# Patient Record
Sex: Female | Born: 1945
Health system: Southern US, Community
[De-identification: ages and names within clinical notes are randomized; demographics above are authoritative.]

## PROBLEM LIST (undated history)

## (undated) ENCOUNTER — Emergency Department (HOSPITAL_COMMUNITY): Payer: Self-pay | Source: Home / Self Care

## (undated) DIAGNOSIS — E079 Disorder of thyroid, unspecified: Secondary | ICD-10-CM

## (undated) DIAGNOSIS — R0989 Other specified symptoms and signs involving the circulatory and respiratory systems: Secondary | ICD-10-CM

## (undated) DIAGNOSIS — I10 Essential (primary) hypertension: Secondary | ICD-10-CM

## (undated) DIAGNOSIS — E785 Hyperlipidemia, unspecified: Secondary | ICD-10-CM

## (undated) HISTORY — PX: ABDOMINAL HYSTERECTOMY: SHX81

## (undated) HISTORY — PX: ROOT CANAL: SHX2363

## (undated) HISTORY — DX: Hyperlipidemia, unspecified: E78.5

## (undated) HISTORY — DX: Disorder of thyroid, unspecified: E07.9

## (undated) HISTORY — PX: NASAL SEPTUM SURGERY: SHX37

## (undated) HISTORY — PX: BREAST SURGERY: SHX581

## (undated) HISTORY — DX: Other specified symptoms and signs involving the circulatory and respiratory systems: R09.89

## (undated) HISTORY — DX: Essential (primary) hypertension: I10

## (undated) HISTORY — PX: BLADDER SURGERY: SHX569

## (undated) HISTORY — PX: APPENDECTOMY: SHX54

---

## 2014-09-15 ENCOUNTER — Encounter: Payer: Self-pay | Admitting: Podiatry

## 2014-09-15 ENCOUNTER — Ambulatory Visit (INDEPENDENT_AMBULATORY_CARE_PROVIDER_SITE_OTHER): Payer: Medicare Other | Admitting: Podiatry

## 2014-09-15 VITALS — BP 148/79 | HR 79 | Resp 18

## 2014-09-15 DIAGNOSIS — S91209A Unspecified open wound of unspecified toe(s) with damage to nail, initial encounter: Secondary | ICD-10-CM

## 2014-09-15 HISTORY — DX: Unspecified open wound of unspecified toe(s) with damage to nail, initial encounter: S91.209A

## 2014-09-15 NOTE — Progress Notes (Signed)
   Subjective:    Patient ID: Jaime Sutton, female    DOB: Mar 02, 1946, 69 y.o.   MRN: [id number]  HPI I HAVE SOME FUNGUS ON BOTH OF MY BIG TOES AND I WORE A PAIR OF BOOTS LAST FALL AND 2 DAYS LATER I NOTICED MY TOENAILS WERE PURPLE AND BLUE AND MY NAILS DID FALL OFF AND THEY GREW BACK BUT THE SKIN HAS GROWN INTO MY TOENAILS This patient presents to discuss her big toenails both feet.  She says she has no pain but her nails stopped growing on her bog toes both feet.  She says there nails fell off but have grown back and stopped growing after they reached the end.  No redness or swelling or pain.  Review of Systems  All other systems reviewed and are negative.      Objective:   Physical Exam Objective: Review of past medical history, medications, social history and allergies were performed.  Vascular: Dorsalis pedis and posterior tibial pulses were palpable B/L, capillary refill was  WNL B/L, temperature gradient was WNL B/L   Skin:  No signs of symptoms of infection or ulcers on both feet  Nails: appear healthy with no signs of mycosis or infections.  The distal aspect of hallux toenails are discolored and growing into the skin at end of nail bed.  Sensory: Phoebe PerchSemmes Weinstein monifilament WNL   Orthopedic: Orthopedic evaluation demonstrates all joints distal t ankle have full ROM without crepitus, muscle power WNL B/L        Assessment & Plan:  Nail trauma Hallux nail B/L  IE.  Discussed nail injury with patient.

## 2015-10-06 HISTORY — PX: CORONARY ANGIOPLASTY WITH STENT PLACEMENT: SHX49

## 2015-11-10 DIAGNOSIS — R0989 Other specified symptoms and signs involving the circulatory and respiratory systems: Secondary | ICD-10-CM

## 2015-11-10 DIAGNOSIS — E785 Hyperlipidemia, unspecified: Secondary | ICD-10-CM | POA: Insufficient documentation

## 2015-11-10 HISTORY — DX: Other specified symptoms and signs involving the circulatory and respiratory systems: R09.89

## 2015-11-10 HISTORY — DX: Hyperlipidemia, unspecified: E78.5

## 2016-04-12 DIAGNOSIS — E039 Hypothyroidism, unspecified: Secondary | ICD-10-CM | POA: Diagnosis not present

## 2016-04-12 DIAGNOSIS — E785 Hyperlipidemia, unspecified: Secondary | ICD-10-CM | POA: Diagnosis not present

## 2016-04-12 DIAGNOSIS — R030 Elevated blood-pressure reading, without diagnosis of hypertension: Secondary | ICD-10-CM | POA: Diagnosis not present

## 2016-04-12 DIAGNOSIS — Z79899 Other long term (current) drug therapy: Secondary | ICD-10-CM | POA: Diagnosis not present

## 2016-04-12 DIAGNOSIS — M5136 Other intervertebral disc degeneration, lumbar region: Secondary | ICD-10-CM | POA: Diagnosis not present

## 2016-04-12 DIAGNOSIS — J309 Allergic rhinitis, unspecified: Secondary | ICD-10-CM | POA: Diagnosis not present

## 2016-05-09 DIAGNOSIS — E785 Hyperlipidemia, unspecified: Secondary | ICD-10-CM | POA: Diagnosis not present

## 2016-05-09 DIAGNOSIS — I10 Essential (primary) hypertension: Secondary | ICD-10-CM

## 2016-05-09 DIAGNOSIS — R0989 Other specified symptoms and signs involving the circulatory and respiratory systems: Secondary | ICD-10-CM | POA: Diagnosis not present

## 2016-05-09 HISTORY — DX: Essential (primary) hypertension: I10

## 2016-05-31 DIAGNOSIS — M5136 Other intervertebral disc degeneration, lumbar region: Secondary | ICD-10-CM | POA: Diagnosis not present

## 2016-05-31 DIAGNOSIS — M79602 Pain in left arm: Secondary | ICD-10-CM | POA: Diagnosis not present

## 2016-05-31 DIAGNOSIS — I10 Essential (primary) hypertension: Secondary | ICD-10-CM | POA: Diagnosis not present

## 2016-05-31 DIAGNOSIS — E785 Hyperlipidemia, unspecified: Secondary | ICD-10-CM | POA: Diagnosis not present

## 2016-05-31 DIAGNOSIS — Z79899 Other long term (current) drug therapy: Secondary | ICD-10-CM | POA: Diagnosis not present

## 2016-05-31 DIAGNOSIS — E039 Hypothyroidism, unspecified: Secondary | ICD-10-CM | POA: Diagnosis not present

## 2016-09-27 DIAGNOSIS — M5136 Other intervertebral disc degeneration, lumbar region: Secondary | ICD-10-CM | POA: Diagnosis not present

## 2016-09-27 DIAGNOSIS — I10 Essential (primary) hypertension: Secondary | ICD-10-CM | POA: Diagnosis not present

## 2016-09-27 DIAGNOSIS — Z79899 Other long term (current) drug therapy: Secondary | ICD-10-CM | POA: Diagnosis not present

## 2016-09-27 DIAGNOSIS — E785 Hyperlipidemia, unspecified: Secondary | ICD-10-CM | POA: Diagnosis not present

## 2016-09-27 DIAGNOSIS — E039 Hypothyroidism, unspecified: Secondary | ICD-10-CM | POA: Diagnosis not present

## 2016-10-18 DIAGNOSIS — Z1231 Encounter for screening mammogram for malignant neoplasm of breast: Secondary | ICD-10-CM | POA: Diagnosis not present

## 2016-10-18 DIAGNOSIS — I10 Essential (primary) hypertension: Secondary | ICD-10-CM | POA: Diagnosis not present

## 2016-11-08 DIAGNOSIS — I10 Essential (primary) hypertension: Secondary | ICD-10-CM | POA: Diagnosis not present

## 2016-12-02 DIAGNOSIS — H2511 Age-related nuclear cataract, right eye: Secondary | ICD-10-CM | POA: Diagnosis not present

## 2016-12-02 DIAGNOSIS — Z1231 Encounter for screening mammogram for malignant neoplasm of breast: Secondary | ICD-10-CM | POA: Diagnosis not present

## 2016-12-02 DIAGNOSIS — H26492 Other secondary cataract, left eye: Secondary | ICD-10-CM | POA: Diagnosis not present

## 2016-12-15 DIAGNOSIS — R928 Other abnormal and inconclusive findings on diagnostic imaging of breast: Secondary | ICD-10-CM | POA: Diagnosis not present

## 2016-12-15 DIAGNOSIS — R922 Inconclusive mammogram: Secondary | ICD-10-CM | POA: Diagnosis not present

## 2016-12-15 DIAGNOSIS — N6489 Other specified disorders of breast: Secondary | ICD-10-CM | POA: Diagnosis not present

## 2016-12-21 DIAGNOSIS — I10 Essential (primary) hypertension: Secondary | ICD-10-CM | POA: Diagnosis not present

## 2016-12-21 DIAGNOSIS — Z6829 Body mass index (BMI) 29.0-29.9, adult: Secondary | ICD-10-CM | POA: Diagnosis not present

## 2017-01-02 DIAGNOSIS — M533 Sacrococcygeal disorders, not elsewhere classified: Secondary | ICD-10-CM | POA: Diagnosis not present

## 2017-01-02 DIAGNOSIS — M4726 Other spondylosis with radiculopathy, lumbar region: Secondary | ICD-10-CM | POA: Diagnosis not present

## 2017-02-01 DIAGNOSIS — E785 Hyperlipidemia, unspecified: Secondary | ICD-10-CM | POA: Diagnosis not present

## 2017-02-01 DIAGNOSIS — R0602 Shortness of breath: Secondary | ICD-10-CM | POA: Diagnosis not present

## 2017-02-01 DIAGNOSIS — J309 Allergic rhinitis, unspecified: Secondary | ICD-10-CM | POA: Diagnosis not present

## 2017-02-01 DIAGNOSIS — R079 Chest pain, unspecified: Secondary | ICD-10-CM | POA: Diagnosis not present

## 2017-02-01 DIAGNOSIS — Z79899 Other long term (current) drug therapy: Secondary | ICD-10-CM | POA: Diagnosis not present

## 2017-02-01 DIAGNOSIS — I10 Essential (primary) hypertension: Secondary | ICD-10-CM | POA: Diagnosis not present

## 2017-02-01 DIAGNOSIS — E039 Hypothyroidism, unspecified: Secondary | ICD-10-CM | POA: Diagnosis not present

## 2017-03-09 DIAGNOSIS — M25521 Pain in right elbow: Secondary | ICD-10-CM | POA: Diagnosis not present

## 2017-03-09 DIAGNOSIS — K219 Gastro-esophageal reflux disease without esophagitis: Secondary | ICD-10-CM | POA: Diagnosis not present

## 2017-04-11 DIAGNOSIS — E039 Hypothyroidism, unspecified: Secondary | ICD-10-CM | POA: Diagnosis not present

## 2017-06-01 DIAGNOSIS — Z79899 Other long term (current) drug therapy: Secondary | ICD-10-CM | POA: Diagnosis not present

## 2017-06-01 DIAGNOSIS — I10 Essential (primary) hypertension: Secondary | ICD-10-CM | POA: Diagnosis not present

## 2017-06-01 DIAGNOSIS — E039 Hypothyroidism, unspecified: Secondary | ICD-10-CM | POA: Diagnosis not present

## 2017-06-01 DIAGNOSIS — K219 Gastro-esophageal reflux disease without esophagitis: Secondary | ICD-10-CM | POA: Diagnosis not present

## 2017-06-01 DIAGNOSIS — J302 Other seasonal allergic rhinitis: Secondary | ICD-10-CM | POA: Diagnosis not present

## 2017-06-01 DIAGNOSIS — E785 Hyperlipidemia, unspecified: Secondary | ICD-10-CM | POA: Diagnosis not present

## 2017-09-04 DIAGNOSIS — D689 Coagulation defect, unspecified: Secondary | ICD-10-CM | POA: Diagnosis not present

## 2017-10-12 ENCOUNTER — Other Ambulatory Visit: Payer: Self-pay

## 2017-10-18 ENCOUNTER — Encounter: Payer: Self-pay | Admitting: Cardiology

## 2017-10-31 ENCOUNTER — Encounter: Payer: Self-pay | Admitting: Cardiology

## 2017-10-31 ENCOUNTER — Ambulatory Visit (INDEPENDENT_AMBULATORY_CARE_PROVIDER_SITE_OTHER): Payer: PPO | Admitting: Cardiology

## 2017-10-31 VITALS — BP 128/72 | HR 75 | Ht 63.0 in | Wt 161.0 lb

## 2017-10-31 DIAGNOSIS — I10 Essential (primary) hypertension: Secondary | ICD-10-CM

## 2017-10-31 DIAGNOSIS — R0989 Other specified symptoms and signs involving the circulatory and respiratory systems: Secondary | ICD-10-CM | POA: Diagnosis not present

## 2017-10-31 DIAGNOSIS — E785 Hyperlipidemia, unspecified: Secondary | ICD-10-CM | POA: Diagnosis not present

## 2017-10-31 NOTE — Progress Notes (Signed)
Cardiology Office Note:    Date:  10/31/2017   ID:  Jaime Sutton, DOB [birth year], MRN [id number]  PCP:  Patient, No Pcp Per  Cardiologist:  Garwin Brothersajan R , MD   Referring MD: No ref. provider found    ASSESSMENT:    1. Bilateral carotid bruits   2. Essential hypertension   3. Dyslipidemia    PLAN:    In order of problems listed above:  1. Secondary prevention stressed with the patient.  Importance of compliance with diet and medication stressed she vocalized understanding.  Her blood pressure is stable.  Diet was discussed with dyslipidemia and lipids are followed by her primary care physician.  2. Patient has bilateral soft carotid bruits and bilateral Doppler.  Importance of losing weight was stressed.  Risks of being overweight also discussed with the patient.  Coronary angiography report done about a couple of years ago was normal.  I discussed this with her and reassured her. 3. Patient will be seen in follow-up appointment in 6 months or earlier if the patient has any concerns    Medication Adjustments/Labs and Tests Ordered: Current medicines are reviewed at length with the patient today.  Concerns regarding medicines are outlined above.  Orders Placed This Encounter  Procedures  . EKG 12-Lead   No orders of the defined types were placed in this encounter.    History of Present Illness:    Jaime Sutton is a [age] y.o. female who is being seen today for the evaluation of hypertension at the request of her primary care physician.  Patient also has dyslipidemia.  She leads a sedentary lifestyle.  She tells me that her daughter had a myocardial infarction and underwent stenting and therefore she is concerned and came here for an evaluation.  She tells me that her daughter was not in the best of health and has a significant history of smoking.  The patient does not smoke.  No chest pain orthopnea or PND.  The patient has had carotid evaluation with plaque in the past.   She requests another evaluation at this time.  Past Medical History:  Diagnosis Date  . Bilateral carotid bruits 11/10/2015  . Dyslipidemia 11/10/2015  . Essential hypertension 05/09/2016  . Thyroid disease     Past Surgical History:  Procedure Laterality Date  . ABDOMINAL HYSTERECTOMY    . APPENDECTOMY    . BLADDER SURGERY    . BREAST SURGERY     breast lift  . CORONARY ANGIOPLASTY WITH STENT PLACEMENT  10/06/2015   Left heart catheterization w/ intervention; surgeon: Merleen MillinerHerman Barrett Cheek, MD,[facility]  . NASAL SEPTUM SURGERY    . ROOT CANAL      Current Medications: Current Meds  Medication Sig  . amLODipine (NORVASC) 5 MG tablet Take 1 tablet by mouth daily.  Marland Kitchen. aspirin EC 81 MG tablet Take by mouth.  . calcium-vitamin D (OSCAL WITH D) 500-200 MG-UNIT TABS tablet Take by mouth.  . gabapentin (NEURONTIN) 300 MG capsule Take 300 mg by mouth as needed.  Marland Kitchen. levothyroxine (SYNTHROID, LEVOTHROID) 75 MCG tablet Take 75 mcg by mouth daily before breakfast.   . meloxicam (MOBIC) 15 MG tablet Take 1 tablet by mouth daily.  . simvastatin (ZOCOR) 20 MG tablet Take 20 mg by mouth daily at 6 PM.      Allergies:   Codeine   Social History   Socioeconomic History  . Marital status: Unknown    Spouse name: Not on file  . Number  of children: Not on file  . Years of education: Not on file  . Highest education level: Not on file  Occupational History  . Not on file  Social Needs  . Financial resource strain: Not on file  . Food insecurity:    Worry: Not on file    Inability: Not on file  . Transportation needs:    Medical: Not on file    Non-medical: Not on file  Tobacco Use  . Smoking status: Never Smoker  . Smokeless tobacco: Never Used  Substance and Sexual Activity  . Alcohol use: Yes    Alcohol/week: 0.0 standard drinks  . Drug use: No  . Sexual activity: Not on file  Lifestyle  . Physical activity:    Days per week: Not on file    Minutes per session: Not on file    . Stress: Not on file  Relationships  . Social connections:    Talks on phone: Not on file    Gets together: Not on file    Attends religious service: Not on file    Active member of club or organization: Not on file    Attends meetings of clubs or organizations: Not on file    Relationship status: Not on file  Other Topics Concern  . Not on file  Social History Narrative  . Not on file     Family History: The patient's family history includes Aneurysm in her sister; Cancer in her sister; Heart attack in her sister; Heart disease in her father; Heart failure in her mother and sister.  ROS:   Please see the history of present illness.    All other systems reviewed and are negative.  EKGs/Labs/Other Studies Reviewed:    The following studies were reviewed today: EKG reveals sinus rhythm and nonspecific ST-T changes.     Recent Labs: No results found for requested labs within last 8760 hours.  Recent Lipid Panel No results found for: CHOL, TRIG, HDL, CHOLHDL, VLDL, LDLCALC, LDLDIRECT  Physical Exam:    VS:  BP 128/72 (BP Location: Right Arm, Patient Position: Sitting, Cuff Size: Normal)   Pulse 75   Ht 5\' 3"  (1.6 m)   Wt 161 lb (73 kg)   SpO2 97%   BMI 28.52 kg/m     Wt Readings from Last 3 Encounters:  10/31/17 161 lb (73 kg)     GEN: Patient is in no acute distress HEENT: Normal NECK: No JVD; bilateral soft carotid bruits LYMPHATICS: No lymphadenopathy CARDIAC: S1 S2 regular, 2/6 systolic murmur at the apex. RESPIRATORY:  Clear to auscultation without rales, wheezing or rhonchi  ABDOMEN: Soft, non-tender, non-distended MUSCULOSKELETAL:  No edema; No deformity  SKIN: Warm and dry NEUROLOGIC:  Alert and oriented x 3 PSYCHIATRIC:  Normal affect    Signed, Garwin Brothers, MD  10/31/2017 9:17 AM    [facility]

## 2017-10-31 NOTE — Patient Instructions (Signed)
Medication Instructions:  Your physician recommends that you continue on your current medications as directed. Please refer to the Current Medication list given to you today.  Labwork: None  Testing/Procedures: Your physician has requested that you have a carotid duplex. This test is an ultrasound of the carotid arteries in your neck. It looks at blood flow through these arteries that supply the brain with blood. Allow one hour for this exam. There are no restrictions or special instructions.  Follow-Up: Your physician recommends that you schedule a follow-up appointment in: 9 months  Any Other Special Instructions Will Be Listed Below (If Applicable).     If you need a refill on your cardiac medications before your next appointment, please call your pharmacy.   [facility]  Garey HamAshley A, RN, BSN

## 2017-11-30 DIAGNOSIS — Z7189 Other specified counseling: Secondary | ICD-10-CM | POA: Diagnosis not present

## 2017-11-30 DIAGNOSIS — Z1339 Encounter for screening examination for other mental health and behavioral disorders: Secondary | ICD-10-CM | POA: Diagnosis not present

## 2017-11-30 DIAGNOSIS — E663 Overweight: Secondary | ICD-10-CM | POA: Diagnosis not present

## 2017-11-30 DIAGNOSIS — Z0000 Encounter for general adult medical examination without abnormal findings: Secondary | ICD-10-CM | POA: Diagnosis not present

## 2017-11-30 DIAGNOSIS — Z6828 Body mass index (BMI) 28.0-28.9, adult: Secondary | ICD-10-CM | POA: Diagnosis not present

## 2017-11-30 DIAGNOSIS — Z1331 Encounter for screening for depression: Secondary | ICD-10-CM | POA: Diagnosis not present

## 2017-11-30 DIAGNOSIS — Z1239 Encounter for other screening for malignant neoplasm of breast: Secondary | ICD-10-CM | POA: Diagnosis not present

## 2017-12-19 ENCOUNTER — Ambulatory Visit (INDEPENDENT_AMBULATORY_CARE_PROVIDER_SITE_OTHER): Payer: PPO

## 2017-12-19 DIAGNOSIS — R0989 Other specified symptoms and signs involving the circulatory and respiratory systems: Secondary | ICD-10-CM | POA: Diagnosis not present

## 2017-12-19 NOTE — Progress Notes (Signed)
Complete carotid duplex exam has been performed. Normal exam.  Jimmy  RDCS, RVT

## 2018-01-15 ENCOUNTER — Telehealth: Payer: Self-pay | Admitting: *Deleted

## 2018-01-15 NOTE — Telephone Encounter (Signed)
Patient informed of normal carotid duplex results. Patient verbalized understanding. No further questions.

## 2018-01-15 NOTE — Telephone Encounter (Signed)
I am not able to tell K myself but will you please ensure that he reads this patient's carotid. It was done in the [place] office and he was DOD, this needs to be read asap as this was almost 1 month ago. Thanks.

## 2018-01-15 NOTE — Telephone Encounter (Signed)
Pt had carotid on 10/15 and would like results. Please advise

## 2018-01-15 NOTE — Telephone Encounter (Signed)
Left message on patient's home phone per DPR for patient to return call to discuss carotid duplex results.

## 2018-01-17 DIAGNOSIS — H25811 Combined forms of age-related cataract, right eye: Secondary | ICD-10-CM | POA: Diagnosis not present

## 2018-03-05 DIAGNOSIS — Z6829 Body mass index (BMI) 29.0-29.9, adult: Secondary | ICD-10-CM | POA: Diagnosis not present

## 2018-03-05 DIAGNOSIS — W010XXA Fall on same level from slipping, tripping and stumbling without subsequent striking against object, initial encounter: Secondary | ICD-10-CM | POA: Diagnosis not present

## 2018-03-05 DIAGNOSIS — E663 Overweight: Secondary | ICD-10-CM | POA: Diagnosis not present

## 2018-03-05 DIAGNOSIS — J40 Bronchitis, not specified as acute or chronic: Secondary | ICD-10-CM | POA: Diagnosis not present

## 2018-03-28 DIAGNOSIS — Z1231 Encounter for screening mammogram for malignant neoplasm of breast: Secondary | ICD-10-CM | POA: Diagnosis not present

## 2018-06-01 DIAGNOSIS — Z6829 Body mass index (BMI) 29.0-29.9, adult: Secondary | ICD-10-CM | POA: Diagnosis not present

## 2018-06-01 DIAGNOSIS — M5136 Other intervertebral disc degeneration, lumbar region: Secondary | ICD-10-CM | POA: Diagnosis not present

## 2018-06-01 DIAGNOSIS — F5101 Primary insomnia: Secondary | ICD-10-CM | POA: Diagnosis not present

## 2018-06-01 DIAGNOSIS — I10 Essential (primary) hypertension: Secondary | ICD-10-CM | POA: Diagnosis not present

## 2018-06-01 DIAGNOSIS — E039 Hypothyroidism, unspecified: Secondary | ICD-10-CM | POA: Diagnosis not present

## 2018-06-01 DIAGNOSIS — E785 Hyperlipidemia, unspecified: Secondary | ICD-10-CM | POA: Diagnosis not present

## 2018-06-01 DIAGNOSIS — Z79899 Other long term (current) drug therapy: Secondary | ICD-10-CM | POA: Diagnosis not present

## 2018-06-26 DIAGNOSIS — Z0000 Encounter for general adult medical examination without abnormal findings: Secondary | ICD-10-CM | POA: Diagnosis not present

## 2018-06-26 DIAGNOSIS — M7989 Other specified soft tissue disorders: Secondary | ICD-10-CM | POA: Diagnosis not present

## 2018-06-26 DIAGNOSIS — Z1331 Encounter for screening for depression: Secondary | ICD-10-CM | POA: Diagnosis not present

## 2018-06-26 DIAGNOSIS — Z7189 Other specified counseling: Secondary | ICD-10-CM | POA: Diagnosis not present

## 2018-06-26 DIAGNOSIS — Z1339 Encounter for screening examination for other mental health and behavioral disorders: Secondary | ICD-10-CM | POA: Diagnosis not present

## 2018-06-26 DIAGNOSIS — Z23 Encounter for immunization: Secondary | ICD-10-CM | POA: Diagnosis not present

## 2018-07-17 DIAGNOSIS — H26492 Other secondary cataract, left eye: Secondary | ICD-10-CM | POA: Diagnosis not present

## 2018-07-17 DIAGNOSIS — H04123 Dry eye syndrome of bilateral lacrimal glands: Secondary | ICD-10-CM | POA: Diagnosis not present

## 2018-07-17 DIAGNOSIS — H26493 Other secondary cataract, bilateral: Secondary | ICD-10-CM | POA: Diagnosis not present

## 2018-10-08 ENCOUNTER — Other Ambulatory Visit: Payer: Self-pay

## 2018-12-04 DIAGNOSIS — R51 Headache: Secondary | ICD-10-CM | POA: Diagnosis not present

## 2018-12-04 DIAGNOSIS — Z6828 Body mass index (BMI) 28.0-28.9, adult: Secondary | ICD-10-CM | POA: Diagnosis not present

## 2018-12-04 DIAGNOSIS — E785 Hyperlipidemia, unspecified: Secondary | ICD-10-CM | POA: Diagnosis not present

## 2018-12-04 DIAGNOSIS — E039 Hypothyroidism, unspecified: Secondary | ICD-10-CM | POA: Diagnosis not present

## 2018-12-04 DIAGNOSIS — I10 Essential (primary) hypertension: Secondary | ICD-10-CM | POA: Diagnosis not present

## 2018-12-04 DIAGNOSIS — M79605 Pain in left leg: Secondary | ICD-10-CM | POA: Diagnosis not present

## 2018-12-04 DIAGNOSIS — M79604 Pain in right leg: Secondary | ICD-10-CM | POA: Diagnosis not present

## 2018-12-04 DIAGNOSIS — Z79899 Other long term (current) drug therapy: Secondary | ICD-10-CM | POA: Diagnosis not present

## 2019-01-01 DIAGNOSIS — M79605 Pain in left leg: Secondary | ICD-10-CM | POA: Diagnosis not present

## 2019-01-01 DIAGNOSIS — M79604 Pain in right leg: Secondary | ICD-10-CM | POA: Diagnosis not present

## 2019-01-01 DIAGNOSIS — M79661 Pain in right lower leg: Secondary | ICD-10-CM | POA: Diagnosis not present

## 2019-01-25 DIAGNOSIS — H52202 Unspecified astigmatism, left eye: Secondary | ICD-10-CM | POA: Diagnosis not present

## 2019-02-18 ENCOUNTER — Ambulatory Visit (INDEPENDENT_AMBULATORY_CARE_PROVIDER_SITE_OTHER): Payer: PPO | Admitting: Cardiology

## 2019-02-18 ENCOUNTER — Other Ambulatory Visit: Payer: Self-pay

## 2019-02-18 ENCOUNTER — Encounter: Payer: Self-pay | Admitting: Cardiology

## 2019-02-18 VITALS — BP 150/80 | HR 74 | Ht 63.0 in | Wt 158.6 lb

## 2019-02-18 DIAGNOSIS — E785 Hyperlipidemia, unspecified: Secondary | ICD-10-CM | POA: Diagnosis not present

## 2019-02-18 DIAGNOSIS — M5136 Other intervertebral disc degeneration, lumbar region: Secondary | ICD-10-CM | POA: Diagnosis not present

## 2019-02-18 DIAGNOSIS — E039 Hypothyroidism, unspecified: Secondary | ICD-10-CM | POA: Diagnosis not present

## 2019-02-18 DIAGNOSIS — I10 Essential (primary) hypertension: Secondary | ICD-10-CM

## 2019-02-18 DIAGNOSIS — Z1211 Encounter for screening for malignant neoplasm of colon: Secondary | ICD-10-CM | POA: Diagnosis not present

## 2019-02-18 DIAGNOSIS — Z6828 Body mass index (BMI) 28.0-28.9, adult: Secondary | ICD-10-CM | POA: Diagnosis not present

## 2019-02-18 NOTE — Progress Notes (Signed)
Cardiology Office Note:    Date:  02/18/2019   ID:  [place], DOB [birth year], MRN [id number]  PCP:  Patient, No Pcp Per  Cardiologist:  Jenean Lindau, MD   Referring MD: No ref. provider found    ASSESSMENT:    1. Essential hypertension   2. Dyslipidemia    PLAN:    In order of problems listed above:  1. Essential hypertension: Blood pressure stable and diet was discussed with the patient 2. Mixed dyslipidemia: Lipids followed by primary care physician.  Diet was emphasized I told her that she needs to walk at least half an hour a day 5 days a week and she promises to do so. 3. Reports of carotid evaluation are mentioned below.  They were detailed with the patient. 4. Patient will be seen in follow-up appointment in 6 months or earlier if the patient has any concerns    Medication Adjustments/Labs and Tests Ordered: Current medicines are reviewed at length with the patient today.  Concerns regarding medicines are outlined above.  No orders of the defined types were placed in this encounter.  No orders of the defined types were placed in this encounter.    No chief complaint on file.    History of Present Illness:    Jaime Sutton is a 73 y.o. female.  Patient has past medical history of essential hypertension and dyslipidemia.  She denies any problems at this time and takes care of activities of daily living.  No chest pain orthopnea or PND.  At the time of my evaluation, the patient is alert awake oriented and in no distress.  She walks on a regular basis but only 2-3 times a week.  With this she has no symptoms.  Past Medical History:  Diagnosis Date  . Bilateral carotid bruits 11/10/2015  . Dyslipidemia 11/10/2015  . Essential hypertension 05/09/2016  . Thyroid disease     Past Surgical History:  Procedure Laterality Date  . ABDOMINAL HYSTERECTOMY    . APPENDECTOMY    . BLADDER SURGERY    . BREAST SURGERY     breast lift  . CORONARY ANGIOPLASTY  WITH STENT PLACEMENT  10/06/2015   Left heart catheterization w/ intervention; surgeon: Alfonso Patten, MD,Location: [facility]  . NASAL SEPTUM SURGERY    . ROOT CANAL      Current Medications: No outpatient medications have been marked as taking for the 02/18/19 encounter (Office Visit) with , Reita Cliche, MD.     Allergies:   Codeine   Social History   Socioeconomic History  . Marital status: Unknown    Spouse name: Not on file  . Number of children: Not on file  . Years of education: Not on file  . Highest education level: Not on file  Occupational History  . Not on file  Tobacco Use  . Smoking status: Never Smoker  . Smokeless tobacco: Never Used  Substance and Sexual Activity  . Alcohol use: Yes    Alcohol/week: 0.0 standard drinks  . Drug use: No  . Sexual activity: Not on file  Other Topics Concern  . Not on file  Social History Narrative  . Not on file   Social Determinants of Health   Financial Resource Strain:   . Difficulty of Paying Living Expenses: Not on file  Food Insecurity:   . Worried About Charity fundraiser in the Last Year: Not on file  . Ran Out of Food in the Last Year: Not  on file  Transportation Needs:   . Lack of Transportation (Medical): Not on file  . Lack of Transportation (Non-Medical): Not on file  Physical Activity:   . Days of Exercise per Week: Not on file  . Minutes of Exercise per Session: Not on file  Stress:   . Feeling of Stress : Not on file  Social Connections:   . Frequency of Communication with Friends and Family: Not on file  . Frequency of Social Gatherings with Friends and Family: Not on file  . Attends Religious Services: Not on file  . Active Member of Clubs or Organizations: Not on file  . Attends Banker Meetings: Not on file  . Marital Status: Not on file     Family History: The patient's family history includes Aneurysm in her sister; Cancer in her sister; Heart attack in her sister;  Heart disease in her father; Heart failure in her mother and sister.  ROS:   Please see the history of present illness.    All other systems reviewed and are negative.  EKGs/Labs/Other Studies Reviewed:    The following studies were reviewed today: Summary: Right Carotid: There is no evidence of stenosis in the right ICA.  Left Carotid: There is no evidence of stenosis in the left ICA.  Vertebrals: Bilateral vertebral arteries demonstrate antegrade flow.  *See table(s) above for measurements and observations.     Electronically signed by Gypsy Balsam MD on 01/15/2018 at 2:05:38 PM.      Recent Labs: No results found for requested labs within last 8760 hours.  Recent Lipid Panel No results found for: CHOL, TRIG, HDL, CHOLHDL, VLDL, LDLCALC, LDLDIRECT  Physical Exam:    VS:  BP (!) 150/80 (BP Location: Left Arm, Patient Position: Sitting, Cuff Size: Normal)   Pulse 74   Ht 5\' 3"  (1.6 m)   Wt 158 lb 9.6 oz (71.9 kg)   SpO2 90%   BMI 28.09 kg/m     Wt Readings from Last 3 Encounters:  02/18/19 158 lb 9.6 oz (71.9 kg)  10/31/17 161 lb (73 kg)     GEN: Patient is in no acute distress HEENT: Normal NECK: No JVD; No carotid bruits LYMPHATICS: No lymphadenopathy CARDIAC: Hear sounds regular, 2/6 systolic murmur at the apex. RESPIRATORY:  Clear to auscultation without rales, wheezing or rhonchi  ABDOMEN: Soft, non-tender, non-distended MUSCULOSKELETAL:  No edema; No deformity  SKIN: Warm and dry NEUROLOGIC:  Alert and oriented x 3 PSYCHIATRIC:  Normal affect   Signed, 11/02/17, MD  02/18/2019 10:18 AM    [facility]

## 2019-02-18 NOTE — Patient Instructions (Signed)
Medication I nstructions:  Your physician recommends that you continue on your current medications as directed. Please refer to the Current Medication list given to you today.  *If you need a refill on your cardiac medications before your next appointment, please call your pharmacy*  Lab Work: None Ordered  Testing/Procedures: None Ordered   Follow-Up: At [facility], you and your health needs are our priority.  As part of our continuing mission to provide you with exceptional heart care, we have created designated Provider Care Teams.  These Care Teams include your primary Cardiologist (physician) and Advanced Practice Providers (APPs -  Physician Assistants and Nurse Practitioners) who all work together to provide you with the care you need, when you need it.  Your next appointment:   6 month(s)  The format for your next appointment:   In Person  Provider:   Rajan Revankar, MD  

## 2019-02-25 DIAGNOSIS — R32 Unspecified urinary incontinence: Secondary | ICD-10-CM | POA: Diagnosis not present

## 2019-06-11 DIAGNOSIS — Z1231 Encounter for screening mammogram for malignant neoplasm of breast: Secondary | ICD-10-CM | POA: Diagnosis not present

## 2019-07-03 DIAGNOSIS — Z79899 Other long term (current) drug therapy: Secondary | ICD-10-CM | POA: Diagnosis not present

## 2019-07-03 DIAGNOSIS — E785 Hyperlipidemia, unspecified: Secondary | ICD-10-CM | POA: Diagnosis not present

## 2019-07-03 DIAGNOSIS — N3946 Mixed incontinence: Secondary | ICD-10-CM | POA: Diagnosis not present

## 2019-07-03 DIAGNOSIS — E039 Hypothyroidism, unspecified: Secondary | ICD-10-CM | POA: Diagnosis not present

## 2019-07-03 DIAGNOSIS — I10 Essential (primary) hypertension: Secondary | ICD-10-CM | POA: Diagnosis not present

## 2019-07-03 DIAGNOSIS — R0683 Snoring: Secondary | ICD-10-CM | POA: Diagnosis not present

## 2019-07-03 DIAGNOSIS — Z1331 Encounter for screening for depression: Secondary | ICD-10-CM | POA: Diagnosis not present

## 2019-07-03 DIAGNOSIS — F5101 Primary insomnia: Secondary | ICD-10-CM | POA: Diagnosis not present

## 2019-07-03 DIAGNOSIS — Z7189 Other specified counseling: Secondary | ICD-10-CM | POA: Diagnosis not present

## 2019-07-03 DIAGNOSIS — Z1339 Encounter for screening examination for other mental health and behavioral disorders: Secondary | ICD-10-CM | POA: Diagnosis not present

## 2019-07-03 DIAGNOSIS — Z1159 Encounter for screening for other viral diseases: Secondary | ICD-10-CM | POA: Diagnosis not present

## 2019-07-03 DIAGNOSIS — M79601 Pain in right arm: Secondary | ICD-10-CM | POA: Diagnosis not present

## 2019-07-03 DIAGNOSIS — Z6829 Body mass index (BMI) 29.0-29.9, adult: Secondary | ICD-10-CM | POA: Diagnosis not present

## 2019-07-03 DIAGNOSIS — Z0000 Encounter for general adult medical examination without abnormal findings: Secondary | ICD-10-CM | POA: Diagnosis not present

## 2019-07-27 DIAGNOSIS — Z20828 Contact with and (suspected) exposure to other viral communicable diseases: Secondary | ICD-10-CM | POA: Diagnosis not present

## 2019-07-27 DIAGNOSIS — J069 Acute upper respiratory infection, unspecified: Secondary | ICD-10-CM | POA: Diagnosis not present

## 2019-07-27 DIAGNOSIS — J0100 Acute maxillary sinusitis, unspecified: Secondary | ICD-10-CM | POA: Diagnosis not present

## 2019-07-27 DIAGNOSIS — R0981 Nasal congestion: Secondary | ICD-10-CM | POA: Diagnosis not present

## 2019-07-31 DIAGNOSIS — H524 Presbyopia: Secondary | ICD-10-CM | POA: Diagnosis not present

## 2019-08-08 DIAGNOSIS — Z1272 Encounter for screening for malignant neoplasm of vagina: Secondary | ICD-10-CM | POA: Diagnosis not present

## 2019-08-08 DIAGNOSIS — R0981 Nasal congestion: Secondary | ICD-10-CM | POA: Diagnosis not present

## 2019-08-08 DIAGNOSIS — D649 Anemia, unspecified: Secondary | ICD-10-CM | POA: Diagnosis not present

## 2019-08-08 DIAGNOSIS — N390 Urinary tract infection, site not specified: Secondary | ICD-10-CM | POA: Diagnosis not present

## 2019-08-08 DIAGNOSIS — Z6829 Body mass index (BMI) 29.0-29.9, adult: Secondary | ICD-10-CM | POA: Diagnosis not present

## 2019-08-27 DIAGNOSIS — Z1211 Encounter for screening for malignant neoplasm of colon: Secondary | ICD-10-CM | POA: Diagnosis not present

## 2019-10-14 DIAGNOSIS — R21 Rash and other nonspecific skin eruption: Secondary | ICD-10-CM | POA: Diagnosis not present

## 2019-10-14 DIAGNOSIS — B356 Tinea cruris: Secondary | ICD-10-CM | POA: Diagnosis not present

## 2020-01-06 DIAGNOSIS — E785 Hyperlipidemia, unspecified: Secondary | ICD-10-CM | POA: Diagnosis not present

## 2020-01-06 DIAGNOSIS — D509 Iron deficiency anemia, unspecified: Secondary | ICD-10-CM | POA: Diagnosis not present

## 2020-01-06 DIAGNOSIS — E039 Hypothyroidism, unspecified: Secondary | ICD-10-CM | POA: Diagnosis not present

## 2020-01-06 DIAGNOSIS — I10 Essential (primary) hypertension: Secondary | ICD-10-CM | POA: Diagnosis not present

## 2020-01-06 DIAGNOSIS — Z79899 Other long term (current) drug therapy: Secondary | ICD-10-CM | POA: Diagnosis not present

## 2020-01-06 DIAGNOSIS — Z6828 Body mass index (BMI) 28.0-28.9, adult: Secondary | ICD-10-CM | POA: Diagnosis not present

## 2020-01-06 DIAGNOSIS — F5101 Primary insomnia: Secondary | ICD-10-CM | POA: Diagnosis not present

## 2020-02-05 ENCOUNTER — Other Ambulatory Visit: Payer: Self-pay

## 2020-02-05 DIAGNOSIS — E079 Disorder of thyroid, unspecified: Secondary | ICD-10-CM | POA: Insufficient documentation

## 2020-02-06 ENCOUNTER — Encounter: Payer: Self-pay | Admitting: Cardiology

## 2020-02-06 ENCOUNTER — Ambulatory Visit: Payer: PPO | Admitting: Cardiology

## 2020-02-06 ENCOUNTER — Telehealth: Payer: Self-pay

## 2020-02-06 ENCOUNTER — Other Ambulatory Visit: Payer: Self-pay

## 2020-02-06 VITALS — BP 155/79 | HR 70 | Ht 63.0 in | Wt 161.6 lb

## 2020-02-06 DIAGNOSIS — I10 Essential (primary) hypertension: Secondary | ICD-10-CM | POA: Diagnosis not present

## 2020-02-06 DIAGNOSIS — E785 Hyperlipidemia, unspecified: Secondary | ICD-10-CM

## 2020-02-06 MED ORDER — AMLODIPINE BESYLATE 10 MG PO TABS: 10.0000 mg | ORAL_TABLET | Freq: Every day | ORAL | 3 refills | Status: DC

## 2020-02-06 NOTE — Patient Instructions (Signed)
Medication Instructions:  Your physician has recommended you make the following change in your medication:   Start Amlodipine 10 mg daily.  *If you need a refill on your cardiac medications before your next appointment, please call your pharmacy*   Lab Work: None ordered If you have labs (blood work) drawn today and your tests are completely normal, you will receive your results only by: Marland Kitchen MyChart Message (if you have MyChart) OR . A paper copy in the mail If you have any lab test that is abnormal or we need to change your treatment, we will call you to review the results.   Testing/Procedures: We will order CT coronary calcium score $150  Please call [phone] to schedule   [facility]  [address]  [place], [place] [address]    Follow-Up: At [facility], you and your health needs are our priority.  As part of our continuing mission to provide you with exceptional heart care, we have created designated Provider Care Teams.  These Care Teams include your primary Cardiologist (physician) and Advanced Practice Providers (APPs -  Physician Assistants and Nurse Practitioners) who all work together to provide you with the care you need, when you need it.  We recommend signing up for the patient portal called "MyChart".  Sign up information is provided on this After Visit Summary.  MyChart is used to connect with patients for Virtual Visits (Telemedicine).  Patients are able to view lab/test results, encounter notes, upcoming appointments, etc.  Non-urgent messages can be sent to your provider as well.   To learn more about what you can do with MyChart, go to [email or web address].    Your next appointment:   1 month(s)  The format for your next appointment:   In Person  Provider:   Belva Crome, MD   Other Instructions  Coronary Calcium Scan A coronary calcium scan is an imaging test used to look for deposits of plaque in the inner lining of the blood  vessels of the heart (coronary arteries). Plaque is made up of calcium, protein, and fatty substances. These deposits of plaque can partly clog and narrow the coronary arteries without producing any symptoms or warning signs. This puts a person at risk for a heart attack. This test is recommended for people who are at moderate risk for heart disease. The test can find plaque deposits before symptoms develop. Tell a health care provider about:  Any allergies you have.  All medicines you are taking, including vitamins, herbs, eye drops, creams, and over-the-counter medicines.  Any problems you or family members have had with anesthetic medicines.  Any blood disorders you have.  Any surgeries you have had.  Any medical conditions you have.  Whether you are pregnant or may be pregnant. What are the risks? Generally, this is a safe procedure. However, problems may occur, including:  Harm to a pregnant woman and her unborn baby. This test involves the use of radiation. Radiation exposure can be dangerous to a pregnant woman and her unborn baby. If you are pregnant or think you may be pregnant, you should not have this procedure done.  Slight increase in the risk of cancer. This is because of the radiation involved in the test. What happens before the procedure? Ask your health care provider for any specific instructions on how to prepare for this procedure. You may be asked to avoid products that contain caffeine, tobacco, or nicotine for 4 hours before the procedure. What happens during  the procedure?   You will undress and remove any jewelry from your neck or chest.  You will put on a hospital gown.  Sticky electrodes will be placed on your chest. The electrodes will be connected to an electrocardiogram (ECG) machine to record a tracing of the electrical activity of your heart.  You will lie down on a curved bed that is attached to the CT scanner.  You may be given medicine to slow  down your heart rate so that clear pictures can be created.  You will be moved into the CT scanner, and the CT scanner will take pictures of your heart. During this time, you will be asked to lie still and hold your breath for 2-3 seconds at a time while each picture of your heart is being taken. The procedure may vary among health care providers and hospitals. What happens after the procedure?  You can get dressed.  You can return to your normal activities.  It is up to you to get the results of your procedure. Ask your health care provider, or the department that is doing the procedure, when your results will be ready. Summary  A coronary calcium scan is an imaging test used to look for deposits of plaque in the inner lining of the blood vessels of the heart (coronary arteries). Plaque is made up of calcium, protein, and fatty substances.  Generally, this is a safe procedure. Tell your health care provider if you are pregnant or may be pregnant.  Ask your health care provider for any specific instructions on how to prepare for this procedure.  A CT scanner will take pictures of your heart.  You can return to your normal activities after the scan is done. This information is not intended to replace advice given to you by your health care provider. Make sure you discuss any questions you have with your health care provider. Document Revised: 09/11/2018 Document Reviewed: 09/11/2018 Elsevier Patient Education  2020 Elsevier Inc. Amlodipine Oral Tablets What is this medicine? AMLODIPINE (am LOE di peen) is a calcium channel blocker. It relaxes your blood vessels and decreases the amount of work the heart has to do. It treats high blood pressure and/or prevents chest pain (also called angina). This medicine may be used for other purposes; ask your health care provider or pharmacist if you have questions. COMMON BRAND NAME(S): Norvasc What should I tell my health care provider before I  take this medicine? They need to know if you have any of these conditions:  heart disease  liver disease  an unusual or allergic reaction to amlodipine, other drugs, foods, dyes, or preservatives  pregnant or trying to get pregnant  breast-feeding How should I use this medicine? Take this drug by mouth. Take it as directed on the prescription label at the same time every day. You can take it with or without food. If it upsets your stomach, take it with food. Keep taking it unless your health care provider tells you to stop. Talk to your health care provider about the use of this drug in children. While it may be prescribed for children as young as 6 for selected conditions, precautions do apply. Overdosage: If you think you have taken too much of this medicine contact a poison control center or emergency room at once. NOTE: This medicine is only for you. Do not share this medicine with others. What if I miss a dose? If you miss a dose, take it as soon as you  can. If it is almost time for your next dose, take only that dose. Do not take double or extra doses. What may interact with this medicine? This medicine may interact with the following medications:  clarithromycin  cyclosporine  diltiazem  itraconazole  simvastatin  tacrolimus This list may not describe all possible interactions. Give your health care provider a list of all the medicines, herbs, non-prescription drugs, or dietary supplements you use. Also tell them if you smoke, drink alcohol, or use illegal drugs. Some items may interact with your medicine. What should I watch for while using this medicine? Visit your health care provider for regular checks on your progress. Check your blood pressure as directed. Ask your health care provider what your blood pressure should be. Also, find out when you should contact him or her. Do not treat yourself for coughs, colds, or pain while you are using this drug without asking your  health care provider for advice. Some drugs may increase your blood pressure. You may get drowsy or dizzy. Do not drive, use machinery, or do anything that needs mental alertness until you know how this drug affects you. Do not stand up or sit up quickly, especially if you are an older patient. This reduces the risk of dizzy or fainting spells. What side effects may I notice from receiving this medicine? Side effects that you should report to your doctor or health care provider as soon as possible:  allergic reactions (skin rash, itching or hives; swelling of the face, lips, or tongue)  heart attack (trouble breathing; pain or tightness in the chest, neck, back or arms; unusually weak or tired)  low blood pressure (dizziness; feeling faint or lightheaded, falls; unusually weak or tired) Side effects that usually do not require medical attention (report these to your doctor or health care provider if they continue or are bothersome):  facial flushing  nausea  palpitations  stomach pain  sudden weight gain  swelling of the ankles, feet, hands This list may not describe all possible side effects. Call your doctor for medical advice about side effects. You may report side effects to FDA at 1-800-FDA-1088. Where should I keep my medicine? Keep out of the reach of children and pets. Store at room temperature between 59 and 86 degrees F (15 and 30 degrees C). Protect from light and moisture. Keep the container tightly closed. Throw away any unused drug after the expiration date. NOTE: This sheet is a summary. It may not cover all possible information. If you have questions about this medicine, talk to your doctor, pharmacist, or health care provider.  2020 Elsevier/Gold Standard (2018-11-27 19:39:45)

## 2020-02-06 NOTE — Progress Notes (Signed)
Cardiology Office Note:    Date:  02/06/2020   ID:  Jaime Sutton, DOB 14-Jul-1945, MRN [id number]  PCP:  Philemon Kingdom, MD  Cardiologist:  Garwin Brothers, MD   Referring MD: No ref. provider found    ASSESSMENT:    1. Essential hypertension   2. Dyslipidemia    PLAN:    In order of problems listed above:  1. Primary prevention stressed with the patient.  Importance of compliance with diet medication stressed and she vocalized understanding.  She has gained weight and is leading a sedentary lifestyle.  Risks explained.  Importance of regular exercise stressed and she promises to do better. 2. Essential hypertension: Blood pressure is elevated.  Lifestyle modification and salt intake issues were discussed.  I increase amlodipine to 10 mg daily.  She will keep a track of her blood pressures and see me in follow-up appointment in a month. 3. Mixed dyslipidemia: Diet was emphasized.  She has family history of coronary artery disease.  I discussed CT calcium scoring and she is agreeable.  Further recommendations will depend except this test. 4. And had multiple questions which were answered to her satisfaction.  Follow-up appointment in a month.   Medication Adjustments/Labs and Tests Ordered: Current medicines are reviewed at length with the patient today.  Concerns regarding medicines are outlined above.  No orders of the defined types were placed in this encounter.  No orders of the defined types were placed in this encounter.    No chief complaint on file.    History of Present Illness:    Jaime Sutton is a 74 y.o. female.  Patient has past medical history of essential hypertension and dyslipidemia.  She denies any problems at this time and takes care of activities of daily living.  No chest pain orthopnea or PND.  At the time of my evaluation, the patient is alert awake oriented and in no distress.  She mentions to me that her blood pressure is elevated even at  home.  Past Medical History:  Diagnosis Date  . Bilateral carotid bruits 11/10/2015  . Dyslipidemia 11/10/2015  . Essential hypertension 05/09/2016  . Thyroid disease   . Traumatic avulsion of nail plate of toe 7/86/7672    Past Surgical History:  Procedure Laterality Date  . ABDOMINAL HYSTERECTOMY    . APPENDECTOMY    . BLADDER SURGERY    . BREAST SURGERY     breast lift  . CORONARY ANGIOPLASTY WITH STENT PLACEMENT  10/06/2015   Left heart catheterization w/ intervention; surgeon: Merleen Milliner, MD,[facility]  . NASAL SEPTUM SURGERY    . ROOT CANAL      Current Medications: Current Meds  Medication Sig  . amLODipine (NORVASC) 5 MG tablet Take 1 tablet by mouth daily.  Marland Kitchen aspirin EC 81 MG tablet Take 81 mg by mouth daily.   . calcium-vitamin D (OSCAL WITH D) 500-200 MG-UNIT TABS tablet Take by mouth.  . gabapentin (NEURONTIN) 300 MG capsule Take 300 mg by mouth as needed.  Marland Kitchen levothyroxine (SYNTHROID) 88 MCG tablet Take 88 mcg by mouth daily.  . meloxicam (MOBIC) 7.5 MG tablet Take 7.5 mg by mouth daily.   . Misc Natural Products (GLUCOSAMINE CHOND CMP TRIPLE) TABS Take 1,500 mg by mouth daily.  . Omega-3 1000 MG CAPS Take 1,000 mg by mouth daily.  . simvastatin (ZOCOR) 20 MG tablet Take 20 mg by mouth daily at 6 PM.      Allergies:  Codeine   Social History   Socioeconomic History  . Marital status: Unknown    Spouse name: Not on file  . Number of children: Not on file  . Years of education: Not on file  . Highest education level: Not on file  Occupational History  . Not on file  Tobacco Use  . Smoking status: Never Smoker  . Smokeless tobacco: Never Used  Substance and Sexual Activity  . Alcohol use: Yes    Alcohol/week: 0.0 standard drinks  . Drug use: No  . Sexual activity: Not on file  Other Topics Concern  . Not on file  Social History Narrative  . Not on file   Social Determinants of Health   Financial Resource Strain:   . Difficulty of  Paying Living Expenses: Not on file  Food Insecurity:   . Worried About Programme researcher, broadcasting/film/video in the Last Year: Not on file  . Ran Out of Food in the Last Year: Not on file  Transportation Needs:   . Lack of Transportation (Medical): Not on file  . Lack of Transportation (Non-Medical): Not on file  Physical Activity:   . Days of Exercise per Week: Not on file  . Minutes of Exercise per Session: Not on file  Stress:   . Feeling of Stress : Not on file  Social Connections:   . Frequency of Communication with Friends and Family: Not on file  . Frequency of Social Gatherings with Friends and Family: Not on file  . Attends Religious Services: Not on file  . Active Member of Clubs or Organizations: Not on file  . Attends Banker Meetings: Not on file  . Marital Status: Not on file     Family History: The patient's family history includes Aneurysm in her sister; Cancer in her sister; Heart attack in her sister; Heart disease in her father; Heart failure in her mother and sister.  ROS:   Please see the history of present illness.    All other systems reviewed and are negative.  EKGs/Labs/Other Studies Reviewed:    The following studies were reviewed today: I discussed my findings with the patient at length.  EKG reveals sinus rhythm and nonspecific ST-T changes.   Recent Labs: No results found for requested labs within last 8760 hours.  Recent Lipid Panel No results found for: CHOL, TRIG, HDL, CHOLHDL, VLDL, LDLCALC, LDLDIRECT  Physical Exam:    VS:  BP (!) 155/79   Pulse 70   Ht 5\' 3"  (1.6 m)   Wt 161 lb 9.6 oz (73.3 kg)   SpO2 97%   BMI 28.63 kg/m     Wt Readings from Last 3 Encounters:  02/06/20 161 lb 9.6 oz (73.3 kg)  02/18/19 158 lb 9.6 oz (71.9 kg)  10/31/17 161 lb (73 kg)     GEN: Patient is in no acute distress HEENT: Normal NECK: No JVD; No carotid bruits LYMPHATICS: No lymphadenopathy CARDIAC: Hear sounds regular, 2/6 systolic murmur at the  apex. RESPIRATORY:  Clear to auscultation without rales, wheezing or rhonchi  ABDOMEN: Soft, non-tender, non-distended MUSCULOSKELETAL:  No edema; No deformity  SKIN: Warm and dry NEUROLOGIC:  Alert and oriented x 3 PSYCHIATRIC:  Normal affect   Signed, 11/02/17, MD  02/06/2020 10:35 AM    [facility]

## 2020-02-06 NOTE — Telephone Encounter (Signed)
Patient given 2 bottles of Aspirin Lot number: [id number] expiration date: 03/23. °

## 2020-02-19 ENCOUNTER — Ambulatory Visit (INDEPENDENT_AMBULATORY_CARE_PROVIDER_SITE_OTHER)
Admission: RE | Admit: 2020-02-19 | Discharge: 2020-02-19 | Disposition: A | Discharge status: Home / Self Care | Payer: Self-pay | Source: Ambulatory Visit | Attending: Cardiology | Admitting: Cardiology

## 2020-02-19 ENCOUNTER — Other Ambulatory Visit: Payer: Self-pay

## 2020-02-19 DIAGNOSIS — E785 Hyperlipidemia, unspecified: Secondary | ICD-10-CM

## 2020-02-19 DIAGNOSIS — I10 Essential (primary) hypertension: Secondary | ICD-10-CM

## 2020-02-26 NOTE — Addendum Note (Signed)
Addended by: Eleonore Chiquito on: 02/26/2020 01:38 PM   Modules accepted: Orders

## 2020-03-04 DIAGNOSIS — E785 Hyperlipidemia, unspecified: Secondary | ICD-10-CM | POA: Diagnosis not present

## 2020-03-04 DIAGNOSIS — I10 Essential (primary) hypertension: Secondary | ICD-10-CM | POA: Diagnosis not present

## 2020-03-05 LAB — HEPATIC FUNCTION PANEL
ALT: 15 IU/L (ref 0–32)
AST: 20 IU/L (ref 0–40)
Albumin: 4.3 g/dL (ref 3.7–4.7)
Alkaline Phosphatase: 92 IU/L (ref 44–121)
Bilirubin Total: 0.4 mg/dL (ref 0.0–1.2)
Bilirubin, Direct: 0.15 mg/dL (ref 0.00–0.40)
Total Protein: 7.1 g/dL (ref 6.0–8.5)

## 2020-03-05 LAB — BASIC METABOLIC PANEL
BUN/Creatinine Ratio: 21 (ref 12–28)
BUN: 19 mg/dL (ref 8–27)
CO2: 29 mmol/L (ref 20–29)
Calcium: 9.5 mg/dL (ref 8.7–10.3)
Chloride: 103 mmol/L (ref 96–106)
Creatinine, Ser: 0.89 mg/dL (ref 0.57–1.00)
GFR calc Af Amer: 74 mL/min/{1.73_m2} (ref >59)
GFR calc non Af Amer: 65 mL/min/{1.73_m2} (ref >59)
Glucose: 95 mg/dL (ref 65–99)
Potassium: 4.4 mmol/L (ref 3.5–5.2)
Sodium: 143 mmol/L (ref 134–144)

## 2020-03-05 LAB — LIPID PANEL
Chol/HDL Ratio: 3.4 ratio (ref 0.0–4.4)
Cholesterol, Total: 212 mg/dL — ABNORMAL HIGH (ref 100–199)
HDL: 63 mg/dL (ref >39)
LDL Chol Calc (NIH): 128 mg/dL — ABNORMAL HIGH (ref 0–99)
Triglycerides: 118 mg/dL (ref 0–149)
VLDL Cholesterol Cal: 21 mg/dL (ref 5–40)

## 2020-03-05 MED ORDER — SIMVASTATIN 40 MG PO TABS: 40.0000 mg | ORAL_TABLET | Freq: Every day | ORAL | 3 refills | Status: DC

## 2020-03-05 NOTE — Addendum Note (Signed)
Addended by: Eleonore Chiquito on: 03/05/2020 12:59 PM   Modules accepted: Orders

## 2020-03-12 ENCOUNTER — Other Ambulatory Visit: Payer: Self-pay

## 2020-03-12 ENCOUNTER — Encounter: Payer: Self-pay | Admitting: Cardiology

## 2020-03-12 ENCOUNTER — Ambulatory Visit: Payer: PPO | Admitting: Cardiology

## 2020-03-12 VITALS — BP 140/62 | HR 84 | Ht 63.0 in | Wt 158.4 lb

## 2020-03-12 DIAGNOSIS — I700 Atherosclerosis of aorta: Secondary | ICD-10-CM

## 2020-03-12 DIAGNOSIS — E785 Hyperlipidemia, unspecified: Secondary | ICD-10-CM | POA: Diagnosis not present

## 2020-03-12 DIAGNOSIS — I10 Essential (primary) hypertension: Secondary | ICD-10-CM

## 2020-03-12 HISTORY — DX: Atherosclerosis of aorta: I70.0

## 2020-03-12 MED ORDER — LOSARTAN POTASSIUM 100 MG PO TABS: 100.0000 mg | ORAL_TABLET | Freq: Every day | ORAL | 3 refills | Status: DC

## 2020-03-12 NOTE — Patient Instructions (Signed)
Medication Instructions:  Your physician has recommended you make the following change in your medication:  STOP: Amlodipine  START: Losartan 100 mg take one tablet by mouth daily. *If you need a refill on your cardiac medications before your next appointment, please call your pharmacy*   Lab Work: Your physician recommends that you return for lab work in: 2 weeks BMP If you have labs (blood work) drawn today and your tests are completely normal, you will receive your results only by: Marland Kitchen MyChart Message (if you have MyChart) OR . A paper copy in the mail If you have any lab test that is abnormal or we need to change your treatment, we will call you to review the results.   Testing/Procedures: None   Follow-Up: At [facility], you and your health needs are our priority.  As part of our continuing mission to provide you with exceptional heart care, we have created designated Provider Care Teams.  These Care Teams include your primary Cardiologist (physician) and Advanced Practice Providers (APPs -  Physician Assistants and Nurse Practitioners) who all work together to provide you with the care you need, when you need it.  We recommend signing up for the patient portal called "MyChart".  Sign up information is provided on this After Visit Summary.  MyChart is used to connect with patients for Virtual Visits (Telemedicine).  Patients are able to view lab/test results, encounter notes, upcoming appointments, etc.  Non-urgent messages can be sent to your provider as well.   To learn more about what you can do with MyChart, go to [email or web address].    Your next appointment:   2 month(s)  The format for your next appointment:   In Person  Provider:   Belva Crome, MD   Other Instructions

## 2020-03-12 NOTE — Progress Notes (Signed)
Cardiology Office Note:    Date:  03/12/2020   ID:  Jaime Sutton, DOB 04/14/1945, MRN [id number]  PCP:  Philemon Kingdom, MD  Cardiologist:  Garwin Brothers, MD   Referring MD: Philemon Kingdom, MD    ASSESSMENT:    1. Essential hypertension   2. Dyslipidemia   3. Aortic atherosclerosis (HCC)    PLAN:    In order of problems listed above:  1. Atherosclerosis of the aorta and elevated calcium score: Secondary prevention stressed with the patient.  Importance of compliance with diet medication stressed and she vocalized understanding.  She was told to walk at least half an hour a day on a regular basis and she promises to do so.  Weight reduction was stressed. 2. Mixed dyslipidemia: Diet was emphasized.  Lipids were reviewed from [facility] sheet and she is going to do better to lose weight and get her lipids better.  Her LDL must be less than 70. 3. Essential hypertension: She is having pedal edema from amlodipine.  I will stop amlodipine.  We will start losartan 100 mg daily she will be back in 2 weeks with a log of her blood pressures and Chem-7. 4. Patient will be seen in follow-up appointment in 2 months or earlier if the patient has any concerns    Medication Adjustments/Labs and Tests Ordered: Current medicines are reviewed at length with the patient today.  Concerns regarding medicines are outlined above.  No orders of the defined types were placed in this encounter.  No orders of the defined types were placed in this encounter.    No chief complaint on file.    History of Present Illness:    Jaime Sutton is a 75 y.o. female.  Patient has past medical history of essential hypertension and dyslipidemia.  She has atherosclerotic vascular disease diagnosed by aortic atherosclerosis and elevated calcium score.  She denies any problems at this time and takes care of activities of daily living.  Her only issue is of bilateral nonpitting pedal edema ever since amlodipine  has been initiated and increased.  At the time of my evaluation she is alert awake oriented and in no distress.  Past Medical History:  Diagnosis Date  . Bilateral carotid bruits 11/10/2015  . Dyslipidemia 11/10/2015  . Essential hypertension 05/09/2016  . Thyroid disease   . Traumatic avulsion of nail plate of toe 3/53/2992    Past Surgical History:  Procedure Laterality Date  . ABDOMINAL HYSTERECTOMY    . APPENDECTOMY    . BLADDER SURGERY    . BREAST SURGERY     breast lift  . CORONARY ANGIOPLASTY WITH STENT PLACEMENT  10/06/2015   Left heart catheterization w/ intervention; surgeon: Merleen Milliner, MD,Location: [facility]  . NASAL SEPTUM SURGERY    . ROOT CANAL      Current Medications: Current Meds  Medication Sig  . amLODipine (NORVASC) 10 MG tablet Take 1 tablet (10 mg total) by mouth daily.  Marland Kitchen aspirin EC 81 MG tablet Take 81 mg by mouth daily.   . calcium-vitamin D (OSCAL WITH D) 500-200 MG-UNIT TABS tablet Take 1 tablet by mouth daily.  Marland Kitchen gabapentin (NEURONTIN) 100 MG capsule Take 100 mg by mouth as needed.  Marland Kitchen levothyroxine (SYNTHROID) 88 MCG tablet Take 88 mcg by mouth daily.  . meloxicam (MOBIC) 7.5 MG tablet Take 7.5 mg by mouth daily.   . Misc Natural Products (GLUCOSAMINE CHOND CMP TRIPLE) TABS Take 1,500 mg by mouth daily.  . Omega-3  1000 MG CAPS Take 1,000 mg by mouth daily.  . simvastatin (ZOCOR) 40 MG tablet Take 1 tablet (40 mg total) by mouth daily at 6 PM.     Allergies:   Codeine   Social History   Socioeconomic History  . Marital status: Unknown    Spouse name: Not on file  . Number of children: Not on file  . Years of education: Not on file  . Highest education level: Not on file  Occupational History  . Not on file  Tobacco Use  . Smoking status: Never Smoker  . Smokeless tobacco: Never Used  Substance and Sexual Activity  . Alcohol use: Yes    Alcohol/week: 0.0 standard drinks  . Drug use: No  . Sexual activity: Not on file  Other Topics  Concern  . Not on file  Social History Narrative  . Not on file   Social Determinants of Health   Financial Resource Strain: Not on file  Food Insecurity: Not on file  Transportation Needs: Not on file  Physical Activity: Not on file  Stress: Not on file  Social Connections: Not on file     Family History: The patient's family history includes Aneurysm in her sister; Cancer in her sister; Heart attack in her sister; Heart disease in her father; Heart failure in her mother and sister.  ROS:   Please see the history of present illness.    All other systems reviewed and are negative.  EKGs/Labs/Other Studies Reviewed:    The following studies were reviewed today: 02/19/2020 17:11  CLINICAL DATA:  Risk stratification  EXAM: Coronary Calcium Score  TECHNIQUE: The patient was scanned on a [employer]. Axial non-contrast 3 mm slices were carried out through the heart. The data set was analyzed on a dedicated work station and scored using the Agatson method.  FINDINGS: Non-cardiac: See separate report from [facility].  Ascending Aorta: Normal.  Atherosclerosis in the ascending aorta.  Pericardium: Normal  Coronary arteries: Normal origin  IMPRESSION: Coronary calcium score of 2. This was 12 percentile for age and sex matched control.  Kardie Tobb,DO   Electronically Signed   By: Thomasene Ripple DO   On: 02/19/2020 17:11   Recent Labs: 03/04/2020: ALT 15; BUN 19; Creatinine, Ser 0.89; Potassium 4.4; Sodium 143  Recent Lipid Panel    Component Value Date/Time   CHOL 212 (H) 03/04/2020 0811   TRIG 118 03/04/2020 0811   HDL 63 03/04/2020 0811   CHOLHDL 3.4 03/04/2020 0811   LDLCALC 128 (H) 03/04/2020 0811    Physical Exam:    VS:  BP 140/62   Pulse 84   Ht 5\' 3"  (1.6 m)   Wt 158 lb 6.4 oz (71.8 kg)   SpO2 95%   BMI 28.06 kg/m     Wt Readings from Last 3 Encounters:  03/12/20 158 lb 6.4 oz (71.8 kg)  02/06/20 161 lb 9.6  oz (73.3 kg)  02/18/19 158 lb 9.6 oz (71.9 kg)     GEN: Patient is in no acute distress HEENT: Normal NECK: No JVD; No carotid bruits LYMPHATICS: No lymphadenopathy CARDIAC: Hear sounds regular, 2/6 systolic murmur at the apex. RESPIRATORY:  Clear to auscultation without rales, wheezing or rhonchi  ABDOMEN: Soft, non-tender, non-distended MUSCULOSKELETAL:  No edema; No deformity  SKIN: Warm and dry NEUROLOGIC:  Alert and oriented x 3 PSYCHIATRIC:  Normal affect   Signed, 02/20/19, MD  03/12/2020 3:28 PM    [facility]

## 2020-03-31 ENCOUNTER — Other Ambulatory Visit: Payer: Self-pay

## 2020-03-31 DIAGNOSIS — I700 Atherosclerosis of aorta: Secondary | ICD-10-CM

## 2020-03-31 DIAGNOSIS — E785 Hyperlipidemia, unspecified: Secondary | ICD-10-CM

## 2020-03-31 DIAGNOSIS — I10 Essential (primary) hypertension: Secondary | ICD-10-CM | POA: Diagnosis not present

## 2020-04-01 LAB — BASIC METABOLIC PANEL
BUN/Creatinine Ratio: 27 (ref 12–28)
BUN: 25 mg/dL (ref 8–27)
CO2: 25 mmol/L (ref 20–29)
Calcium: 9.5 mg/dL (ref 8.7–10.3)
Chloride: 100 mmol/L (ref 96–106)
Creatinine, Ser: 0.92 mg/dL (ref 0.57–1.00)
GFR calc Af Amer: 71 mL/min/{1.73_m2} (ref >59)
GFR calc non Af Amer: 62 mL/min/{1.73_m2} (ref >59)
Glucose: 95 mg/dL (ref 65–99)
Potassium: 4.5 mmol/L (ref 3.5–5.2)
Sodium: 137 mmol/L (ref 134–144)

## 2020-05-04 DIAGNOSIS — M5136 Other intervertebral disc degeneration, lumbar region: Secondary | ICD-10-CM | POA: Diagnosis not present

## 2020-05-04 DIAGNOSIS — E039 Hypothyroidism, unspecified: Secondary | ICD-10-CM | POA: Diagnosis not present

## 2020-05-04 DIAGNOSIS — I10 Essential (primary) hypertension: Secondary | ICD-10-CM | POA: Diagnosis not present

## 2020-05-04 DIAGNOSIS — E785 Hyperlipidemia, unspecified: Secondary | ICD-10-CM | POA: Diagnosis not present

## 2020-05-12 ENCOUNTER — Other Ambulatory Visit: Payer: Self-pay

## 2020-05-12 ENCOUNTER — Ambulatory Visit: Payer: PPO | Admitting: Cardiology

## 2020-05-12 ENCOUNTER — Encounter: Payer: Self-pay | Admitting: Cardiology

## 2020-05-12 VITALS — BP 118/58 | HR 74 | Ht 63.0 in | Wt 144.2 lb

## 2020-05-12 DIAGNOSIS — I10 Essential (primary) hypertension: Secondary | ICD-10-CM

## 2020-05-12 DIAGNOSIS — E785 Hyperlipidemia, unspecified: Secondary | ICD-10-CM

## 2020-05-12 DIAGNOSIS — I700 Atherosclerosis of aorta: Secondary | ICD-10-CM | POA: Diagnosis not present

## 2020-05-12 NOTE — Progress Notes (Signed)
Cardiology Office Note:    Date:  05/12/2020   ID:  Jaime Sutton, DOB 10-20-1945, MRN [id number]  PCP:  Philemon Kingdom, MD  Cardiologist:  Garwin Brothers, MD   Referring MD: Philemon Kingdom, MD    ASSESSMENT:    1. Aortic atherosclerosis (HCC)   2. Essential hypertension   3. Dyslipidemia    PLAN:    In order of problems listed above:  1. Atherosclerotic vascular disease: Secondary prevention stressed to the patient.  Importance of compliance with diet medication stressed and she vocalized understanding.  She was advised to walk at least half an hour a day on a daily basis and she promises to do so. 2. Essential hypertension: Blood pressure stable and diet was emphasized.  She is happy because she is now she is off the amlodipine and her blood pressure stabilized and her pedal edema is gone.  Lifestyle modification and diet advised. 3. Mixed dyslipidemia: Lab work from December was reviewed.  I will get records from her primary care physician.  Her LDL must be less than 70 and I will review this and advise her accordingly.  Diet was emphasized.  She promises to do better. 4. Patient will be seen in follow-up appointment in 6 months or earlier if the patient has any concerns    Medication Adjustments/Labs and Tests Ordered: Current medicines are reviewed at length with the patient today.  Concerns regarding medicines are outlined above.  No orders of the defined types were placed in this encounter.  No orders of the defined types were placed in this encounter.    No chief complaint on file.    History of Present Illness:    Jaime Sutton is a 75 y.o. female.  Patient has past medical history of atherosclerotic vascular disease documented by aortic atherosclerosis, essential hypertension and dyslipidemia.  She denies any problems at this time and takes care of activities of daily living.  No chest pain orthopnea or PND.  She exercises on a regular basis.  At the  time of my evaluation, the patient is alert awake oriented and in no distress.  Past Medical History:  Diagnosis Date  . Aortic atherosclerosis (HCC) 03/12/2020  . Bilateral carotid bruits 11/10/2015  . Dyslipidemia 11/10/2015  . Essential hypertension 05/09/2016  . Thyroid disease   . Traumatic avulsion of nail plate of toe 5/88/5027    Past Surgical History:  Procedure Laterality Date  . ABDOMINAL HYSTERECTOMY    . APPENDECTOMY    . BLADDER SURGERY    . BREAST SURGERY     breast lift  . CORONARY ANGIOPLASTY WITH STENT PLACEMENT  10/06/2015   Left heart catheterization w/ intervention; surgeon: Merleen Milliner, MD,Location: [facility]  . NASAL SEPTUM SURGERY    . ROOT CANAL      Current Medications: Current Meds  Medication Sig  . aspirin EC 81 MG tablet Take 81 mg by mouth every other day.  . calcium-vitamin D (OSCAL WITH D) 500-200 MG-UNIT TABS tablet Take 1 tablet by mouth daily.  Marland Kitchen gabapentin (NEURONTIN) 100 MG capsule Take 100 mg by mouth as needed (pain).  Marland Kitchen levothyroxine (SYNTHROID) 88 MCG tablet Take 88 mcg by mouth daily.  Marland Kitchen losartan (COZAAR) 100 MG tablet Take 1 tablet (100 mg total) by mouth daily.  . meloxicam (MOBIC) 15 MG tablet Take 15 mg by mouth daily.  . Misc Natural Products (GLUCOSAMINE CHOND CMP TRIPLE) TABS Take 1,500 mg by mouth daily.  . Omega-3 1000 MG  CAPS Take 1,000 mg by mouth daily.  . simvastatin (ZOCOR) 40 MG tablet Take 1 tablet (40 mg total) by mouth daily at 6 PM.     Allergies:   Codeine   Social History   Socioeconomic History  . Marital status: Unknown    Spouse name: Not on file  . Number of children: Not on file  . Years of education: Not on file  . Highest education level: Not on file  Occupational History  . Not on file  Tobacco Use  . Smoking status: Never Smoker  . Smokeless tobacco: Never Used  Substance and Sexual Activity  . Alcohol use: Yes    Alcohol/week: 0.0 standard drinks  . Drug use: No  . Sexual activity: Not on  file  Other Topics Concern  . Not on file  Social History Narrative  . Not on file   Social Determinants of Health   Financial Resource Strain: Not on file  Food Insecurity: Not on file  Transportation Needs: Not on file  Physical Activity: Not on file  Stress: Not on file  Social Connections: Not on file     Family History: The patient's family history includes Aneurysm in her sister; Cancer in her sister; Heart attack in her sister; Heart disease in her father; Heart failure in her mother and sister.  ROS:   Please see the history of present illness.    All other systems reviewed and are negative.  EKGs/Labs/Other Studies Reviewed:    The following studies were reviewed today: CLINICAL DATA:  Risk stratification  EXAM: Coronary Calcium Score  TECHNIQUE: The patient was scanned on a [employer] scanner. Axial non-contrast 3 mm slices were carried out through the heart. The data set was analyzed on a dedicated work station and scored using the Agatson method.  FINDINGS: Non-cardiac: See separate report from [facility].  Ascending Aorta: Normal.  Atherosclerosis in the ascending aorta.  Pericardium: Normal  Coronary arteries: Normal origin  IMPRESSION: Coronary calcium score of 2. This was 12 percentile for age and sex matched control.  Kardie Tobb,DO   Electronically Signed   By: Thomasene Ripple DO   On: 02/19/2020 17:11   Recent Labs: 03/04/2020: ALT 15 03/31/2020: BUN 25; Creatinine, Ser 0.92; Potassium 4.5; Sodium 137  Recent Lipid Panel    Component Value Date/Time   CHOL 212 (H) 03/04/2020 0811   TRIG 118 03/04/2020 0811   HDL 63 03/04/2020 0811   CHOLHDL 3.4 03/04/2020 0811   LDLCALC 128 (H) 03/04/2020 0811    Physical Exam:    VS:  BP (!) 118/58   Pulse 74   Ht 5\' 3"  (1.6 m)   Wt 144 lb 3.2 oz (65.4 kg)   SpO2 96%   BMI 25.54 kg/m     Wt Readings from Last 3 Encounters:  05/12/20 144 lb 3.2 oz (65.4 kg)   03/12/20 158 lb 6.4 oz (71.8 kg)  02/06/20 161 lb 9.6 oz (73.3 kg)     GEN: Patient is in no acute distress HEENT: Normal NECK: No JVD; No carotid bruits LYMPHATICS: No lymphadenopathy CARDIAC: Hear sounds regular, 2/6 systolic murmur at the apex. RESPIRATORY:  Clear to auscultation without rales, wheezing or rhonchi  ABDOMEN: Soft, non-tender, non-distended MUSCULOSKELETAL:  No edema; No deformity  SKIN: Warm and dry NEUROLOGIC:  Alert and oriented x 3 PSYCHIATRIC:  Normal affect   Signed, 14/02/21, MD  05/12/2020 3:11 PM    [facility]

## 2020-05-12 NOTE — Patient Instructions (Signed)

## 2020-06-26 DIAGNOSIS — Z1231 Encounter for screening mammogram for malignant neoplasm of breast: Secondary | ICD-10-CM | POA: Diagnosis not present

## 2020-07-04 DIAGNOSIS — E039 Hypothyroidism, unspecified: Secondary | ICD-10-CM | POA: Diagnosis not present

## 2020-07-04 DIAGNOSIS — E785 Hyperlipidemia, unspecified: Secondary | ICD-10-CM | POA: Diagnosis not present

## 2020-07-04 DIAGNOSIS — M5136 Other intervertebral disc degeneration, lumbar region: Secondary | ICD-10-CM | POA: Diagnosis not present

## 2020-07-04 DIAGNOSIS — I10 Essential (primary) hypertension: Secondary | ICD-10-CM | POA: Diagnosis not present

## 2020-07-07 DIAGNOSIS — Z79899 Other long term (current) drug therapy: Secondary | ICD-10-CM | POA: Diagnosis not present

## 2020-07-07 DIAGNOSIS — Z7189 Other specified counseling: Secondary | ICD-10-CM | POA: Diagnosis not present

## 2020-07-07 DIAGNOSIS — E785 Hyperlipidemia, unspecified: Secondary | ICD-10-CM | POA: Diagnosis not present

## 2020-07-07 DIAGNOSIS — M542 Cervicalgia: Secondary | ICD-10-CM | POA: Diagnosis not present

## 2020-07-07 DIAGNOSIS — Z1339 Encounter for screening examination for other mental health and behavioral disorders: Secondary | ICD-10-CM | POA: Diagnosis not present

## 2020-07-07 DIAGNOSIS — D509 Iron deficiency anemia, unspecified: Secondary | ICD-10-CM | POA: Diagnosis not present

## 2020-07-07 DIAGNOSIS — E039 Hypothyroidism, unspecified: Secondary | ICD-10-CM | POA: Diagnosis not present

## 2020-07-07 DIAGNOSIS — Z1331 Encounter for screening for depression: Secondary | ICD-10-CM | POA: Diagnosis not present

## 2020-07-07 DIAGNOSIS — M5136 Other intervertebral disc degeneration, lumbar region: Secondary | ICD-10-CM | POA: Diagnosis not present

## 2020-07-07 DIAGNOSIS — Z6825 Body mass index (BMI) 25.0-25.9, adult: Secondary | ICD-10-CM | POA: Diagnosis not present

## 2020-07-07 DIAGNOSIS — Z0000 Encounter for general adult medical examination without abnormal findings: Secondary | ICD-10-CM | POA: Diagnosis not present

## 2020-07-07 DIAGNOSIS — I10 Essential (primary) hypertension: Secondary | ICD-10-CM | POA: Diagnosis not present

## 2020-07-07 DIAGNOSIS — F5101 Primary insomnia: Secondary | ICD-10-CM | POA: Diagnosis not present

## 2020-07-22 DIAGNOSIS — M4722 Other spondylosis with radiculopathy, cervical region: Secondary | ICD-10-CM | POA: Diagnosis not present

## 2020-07-22 DIAGNOSIS — M4312 Spondylolisthesis, cervical region: Secondary | ICD-10-CM | POA: Diagnosis not present

## 2020-07-24 DIAGNOSIS — M542 Cervicalgia: Secondary | ICD-10-CM | POA: Diagnosis not present

## 2020-07-24 DIAGNOSIS — M4312 Spondylolisthesis, cervical region: Secondary | ICD-10-CM | POA: Diagnosis not present

## 2020-08-07 DIAGNOSIS — M4722 Other spondylosis with radiculopathy, cervical region: Secondary | ICD-10-CM | POA: Diagnosis not present

## 2020-08-07 DIAGNOSIS — M4312 Spondylolisthesis, cervical region: Secondary | ICD-10-CM | POA: Diagnosis not present

## 2020-09-03 DIAGNOSIS — M5136 Other intervertebral disc degeneration, lumbar region: Secondary | ICD-10-CM | POA: Diagnosis not present

## 2020-09-03 DIAGNOSIS — I10 Essential (primary) hypertension: Secondary | ICD-10-CM | POA: Diagnosis not present

## 2020-09-03 DIAGNOSIS — E785 Hyperlipidemia, unspecified: Secondary | ICD-10-CM | POA: Diagnosis not present

## 2020-09-03 DIAGNOSIS — E039 Hypothyroidism, unspecified: Secondary | ICD-10-CM | POA: Diagnosis not present

## 2020-10-20 DIAGNOSIS — M4312 Spondylolisthesis, cervical region: Secondary | ICD-10-CM | POA: Diagnosis not present

## 2020-10-20 DIAGNOSIS — M5116 Intervertebral disc disorders with radiculopathy, lumbar region: Secondary | ICD-10-CM | POA: Diagnosis not present

## 2020-10-20 DIAGNOSIS — M4726 Other spondylosis with radiculopathy, lumbar region: Secondary | ICD-10-CM | POA: Diagnosis not present

## 2020-11-02 DIAGNOSIS — M5116 Intervertebral disc disorders with radiculopathy, lumbar region: Secondary | ICD-10-CM | POA: Diagnosis not present

## 2020-11-05 ENCOUNTER — Other Ambulatory Visit: Payer: Self-pay

## 2020-11-11 ENCOUNTER — Ambulatory Visit: Payer: PPO | Admitting: Cardiology

## 2020-11-11 ENCOUNTER — Other Ambulatory Visit: Payer: Self-pay

## 2020-11-11 ENCOUNTER — Encounter: Payer: Self-pay | Admitting: Cardiology

## 2020-11-11 VITALS — BP 118/62 | HR 80 | Ht 62.0 in | Wt 146.0 lb

## 2020-11-11 DIAGNOSIS — E785 Hyperlipidemia, unspecified: Secondary | ICD-10-CM

## 2020-11-11 DIAGNOSIS — I700 Atherosclerosis of aorta: Secondary | ICD-10-CM

## 2020-11-11 DIAGNOSIS — I10 Essential (primary) hypertension: Secondary | ICD-10-CM

## 2020-11-11 NOTE — Progress Notes (Signed)
Cardiology Office Note:    Date:  11/11/2020   ID:  Jaime Sutton, DOB 09-03-1945, MRN [id number]  PCP:  Philemon Kingdom, MD  Cardiologist:  Garwin Brothers, MD   Referring MD: Philemon Kingdom, MD    ASSESSMENT:    1. Aortic atherosclerosis (HCC)   2. Essential hypertension   3. Dyslipidemia    PLAN:    In order of problems listed above:  Primary prevention stressed with the patient.  Importance of compliance with diet medication stressed and vocalized understanding.  She was advised to walk at least half an hour a day 5 days a week and she promises to do so. Essential hypertension: Blood pressure stable and diet was emphasized. Aortic atherosclerosis: Secondary prevention stressed in terms of exercise and lipid-lowering Mixed dyslipidemia: Lipids were discussed and they are fine and diet emphasized.  She is on statin therapy. Patient will be seen in follow-up appointment in 6 months or earlier if the patient has any concerns    Medication Adjustments/Labs and Tests Ordered: Current medicines are reviewed at length with the patient today.  Concerns regarding medicines are outlined above.  No orders of the defined types were placed in this encounter.  No orders of the defined types were placed in this encounter.    No chief complaint on file.    History of Present Illness:    Jaime Sutton is a 75 y.o. female.  Patient has past medical history of essential hypertension, aortic atherosclerosis and dyslipidemia.  She denies any problems at this time and takes care of activities of daily living. Patient mentions to me that because of orthopedic issues she has issues with ambulation but she does her best to exercise on a regular basis.  At the time of my evaluation, the patient is alert awake oriented and in no distress.  Past Medical History:  Diagnosis Date   Aortic atherosclerosis (HCC) 03/12/2020   Bilateral carotid bruits 11/10/2015   Dyslipidemia 11/10/2015    Essential hypertension 05/09/2016   Thyroid disease    Traumatic avulsion of nail plate of toe 07/22/6158    Past Surgical History:  Procedure Laterality Date   ABDOMINAL HYSTERECTOMY     APPENDECTOMY     BLADDER SURGERY     BREAST SURGERY     breast lift   CORONARY ANGIOPLASTY WITH STENT PLACEMENT  10/06/2015   Left heart catheterization w/ intervention; surgeon: Merleen Milliner, MD,Location: [facility]   NASAL SEPTUM SURGERY     ROOT CANAL      Current Medications: Current Meds  Medication Sig   aspirin EC 81 MG tablet Take 81 mg by mouth every other day.   calcium-vitamin D (OSCAL WITH D) 500-200 MG-UNIT TABS tablet Take 1 tablet by mouth daily.   gabapentin (NEURONTIN) 100 MG capsule Take 100 mg by mouth as needed for pain (pain).   levothyroxine (SYNTHROID) 88 MCG tablet Take 88 mcg by mouth daily.   losartan (COZAAR) 100 MG tablet Take 100 mg by mouth daily.   meloxicam (MOBIC) 15 MG tablet Take 15 mg by mouth daily.   Misc Natural Products (GLUCOSAMINE CHOND CMP TRIPLE) TABS Take 1,500 mg by mouth daily.   Omega-3 1000 MG CAPS Take 1,000 mg by mouth daily.   simvastatin (ZOCOR) 40 MG tablet Take 1 tablet (40 mg total) by mouth daily at 6 PM.     Allergies:   Codeine   Social History   Socioeconomic History   Marital status: Unknown  Spouse name: Not on file   Number of children: Not on file   Years of education: Not on file   Highest education level: Not on file  Occupational History   Not on file  Tobacco Use   Smoking status: Never   Smokeless tobacco: Never  Substance and Sexual Activity   Alcohol use: Yes    Alcohol/week: 0.0 standard drinks   Drug use: No   Sexual activity: Not on file  Other Topics Concern   Not on file  Social History Narrative   Not on file   Social Determinants of Health   Financial Resource Strain: Not on file  Food Insecurity: Not on file  Transportation Needs: Not on file  Physical Activity: Not on file  Stress: Not on  file  Social Connections: Not on file     Family History: The patient's family history includes Aneurysm in her sister; Cancer in her sister; Heart attack in her sister; Heart disease in her father; Heart failure in her mother and sister.  ROS:   Please see the history of present illness.    All other systems reviewed and are negative.  EKGs/Labs/Other Studies Reviewed:    The following studies were reviewed today: I discussed my findings with the patient at length.   Recent Labs: 03/04/2020: ALT 15 03/31/2020: BUN 25; Creatinine, Ser 0.92; Potassium 4.5; Sodium 137  Recent Lipid Panel    Component Value Date/Time   CHOL 212 (H) 03/04/2020 0811   TRIG 118 03/04/2020 0811   HDL 63 03/04/2020 0811   CHOLHDL 3.4 03/04/2020 0811   LDLCALC 128 (H) 03/04/2020 0811    Physical Exam:    VS:  BP 118/62   Pulse 80   Ht 5\' 2"  (1.575 m)   Wt 146 lb (66.2 kg)   SpO2 97%   BMI 26.70 kg/m     Wt Readings from Last 3 Encounters:  11/11/20 146 lb (66.2 kg)  05/12/20 144 lb 3.2 oz (65.4 kg)  03/12/20 158 lb 6.4 oz (71.8 kg)     GEN: Patient is in no acute distress HEENT: Normal NECK: No JVD; No carotid bruits LYMPHATICS: No lymphadenopathy CARDIAC: Hear sounds regular, 2/6 systolic murmur at the apex. RESPIRATORY:  Clear to auscultation without rales, wheezing or rhonchi  ABDOMEN: Soft, non-tender, non-distended MUSCULOSKELETAL:  No edema; No deformity  SKIN: Warm and dry NEUROLOGIC:  Alert and oriented x 3 PSYCHIATRIC:  Normal affect   Signed, 05/10/20, MD  11/11/2020 9:37 AM    [facility]

## 2020-11-11 NOTE — Patient Instructions (Signed)

## 2020-11-30 DIAGNOSIS — M4312 Spondylolisthesis, cervical region: Secondary | ICD-10-CM | POA: Diagnosis not present

## 2020-11-30 DIAGNOSIS — M5116 Intervertebral disc disorders with radiculopathy, lumbar region: Secondary | ICD-10-CM | POA: Diagnosis not present

## 2020-12-04 DIAGNOSIS — E785 Hyperlipidemia, unspecified: Secondary | ICD-10-CM | POA: Diagnosis not present

## 2020-12-04 DIAGNOSIS — E039 Hypothyroidism, unspecified: Secondary | ICD-10-CM | POA: Diagnosis not present

## 2020-12-04 DIAGNOSIS — I10 Essential (primary) hypertension: Secondary | ICD-10-CM | POA: Diagnosis not present

## 2020-12-17 DIAGNOSIS — M545 Low back pain, unspecified: Secondary | ICD-10-CM | POA: Diagnosis not present

## 2020-12-17 DIAGNOSIS — M5116 Intervertebral disc disorders with radiculopathy, lumbar region: Secondary | ICD-10-CM | POA: Diagnosis not present

## 2020-12-29 DIAGNOSIS — M5431 Sciatica, right side: Secondary | ICD-10-CM | POA: Diagnosis not present

## 2020-12-29 DIAGNOSIS — M4726 Other spondylosis with radiculopathy, lumbar region: Secondary | ICD-10-CM | POA: Diagnosis not present

## 2020-12-29 DIAGNOSIS — M5116 Intervertebral disc disorders with radiculopathy, lumbar region: Secondary | ICD-10-CM | POA: Diagnosis not present

## 2021-01-14 DIAGNOSIS — D509 Iron deficiency anemia, unspecified: Secondary | ICD-10-CM | POA: Diagnosis not present

## 2021-01-14 DIAGNOSIS — E785 Hyperlipidemia, unspecified: Secondary | ICD-10-CM | POA: Diagnosis not present

## 2021-01-14 DIAGNOSIS — E039 Hypothyroidism, unspecified: Secondary | ICD-10-CM | POA: Diagnosis not present

## 2021-01-14 DIAGNOSIS — Z79899 Other long term (current) drug therapy: Secondary | ICD-10-CM | POA: Diagnosis not present

## 2021-01-14 DIAGNOSIS — N390 Urinary tract infection, site not specified: Secondary | ICD-10-CM | POA: Diagnosis not present

## 2021-01-14 DIAGNOSIS — Z6828 Body mass index (BMI) 28.0-28.9, adult: Secondary | ICD-10-CM | POA: Diagnosis not present

## 2021-01-14 DIAGNOSIS — I10 Essential (primary) hypertension: Secondary | ICD-10-CM | POA: Diagnosis not present

## 2021-02-02 IMAGING — CT CT CARDIAC CORONARY ARTERY CALCIUM SCORE
3 series · 14 of 20 positions shown, 15 images · non-contrast
Comparison: None.
COMPARISON: None.

Addendum:
EXAM:
OVER-READ INTERPRETATION  CT CHEST

The following report is an over-read performed by radiologist Dr.
Hamish Eric Waltho [REDACTED] on 02/19/2020. This
over-read does not include interpretation of cardiac or coronary
anatomy or pathology. The coronary calcium score interpretation by
the cardiologist is attached.
CLINICAL DATA: Risk stratification
Coronary Calcium Score
TECHNIQUE: The patient was scanned on a [employer] scanner. Axial
non-contrast 3 mm slices were carried out through the heart. The
data set was analyzed on a dedicated work station and scored using
the Agatson method.

[Series 2: casc 3.0 bv41 2 bestdiast 70 % · axial · 0.34mm/px · z∈[-239,-161]mm · 4 of 44 slices shown, 5 images]
[im 9/44  vessel]
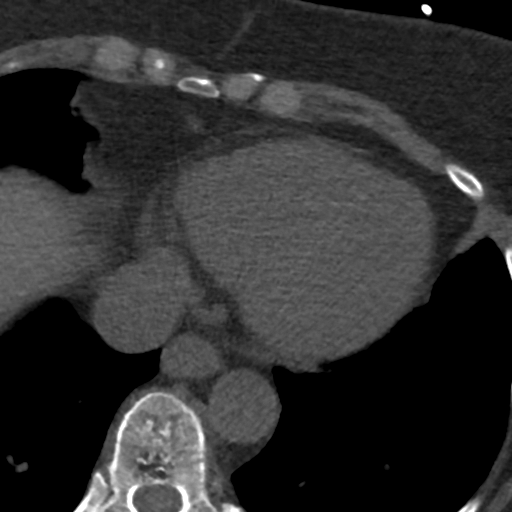
[im 9/44  lung]
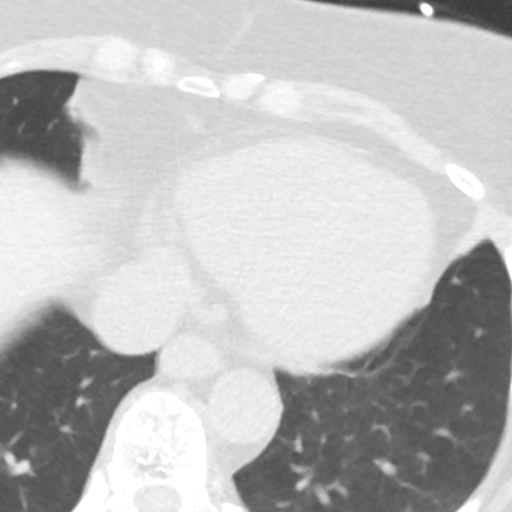
[im 18/44  vessel]
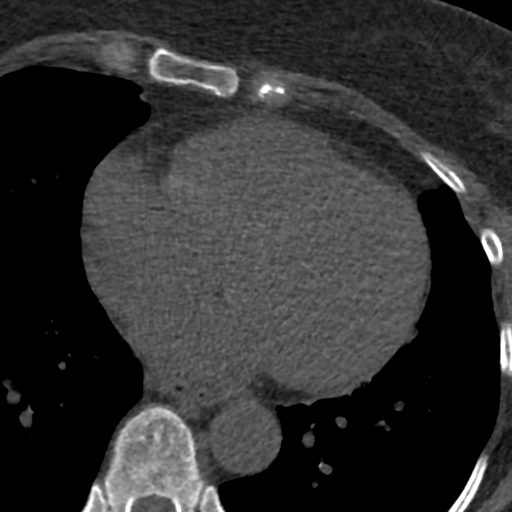
[im 26/44  vessel]
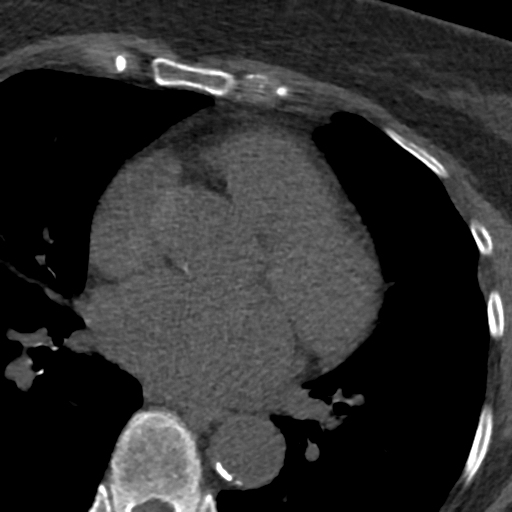
[im 35/44  vessel]
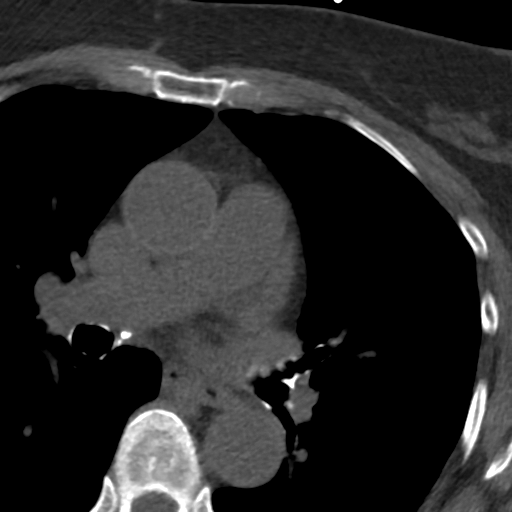

[Series 3: lung 71 % · axial · 0.64mm/px · z∈[-242,-158]mm · 5 of 44 slices shown]
[im 8/44  lung]
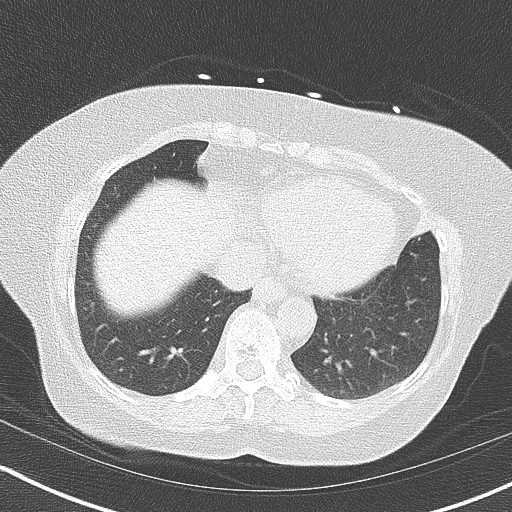
[im 15/44  lung]
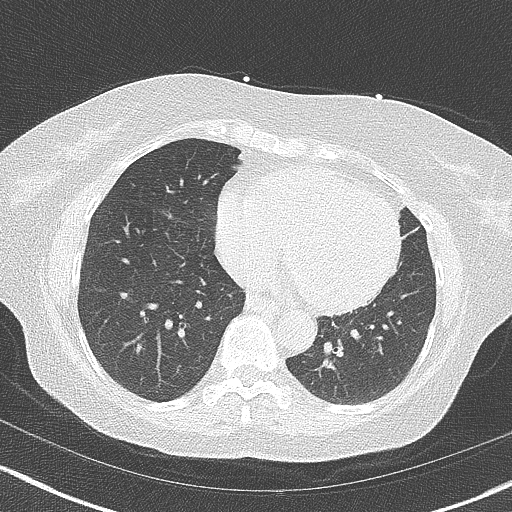
[im 22/44  lung]
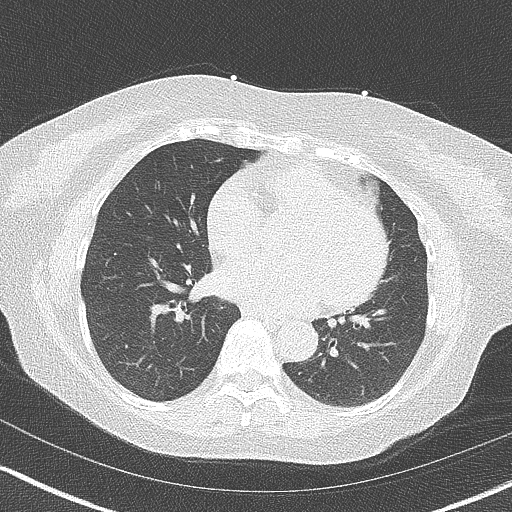
[im 29/44  lung]
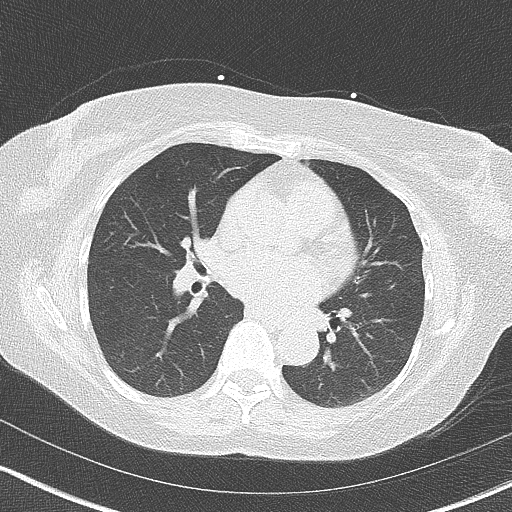
[im 36/44  lung]
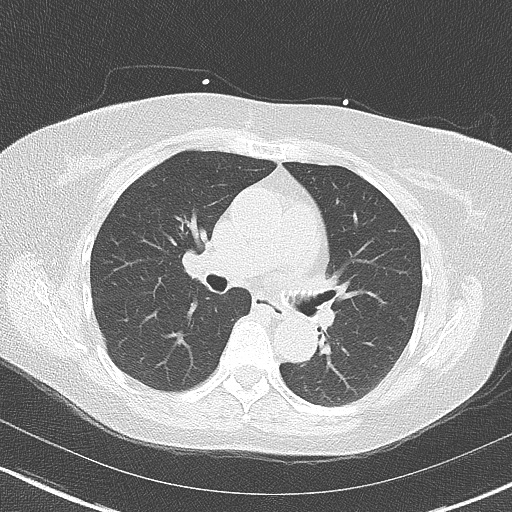

[Series 4: lung st 71 % · axial · 0.64mm/px · z∈[-242,-158]mm · 5 of 44 slices shown]
[im 8/44  lung]
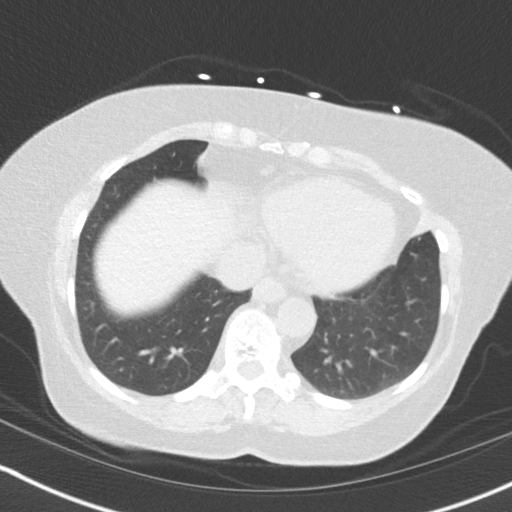
[im 15/44  lung]
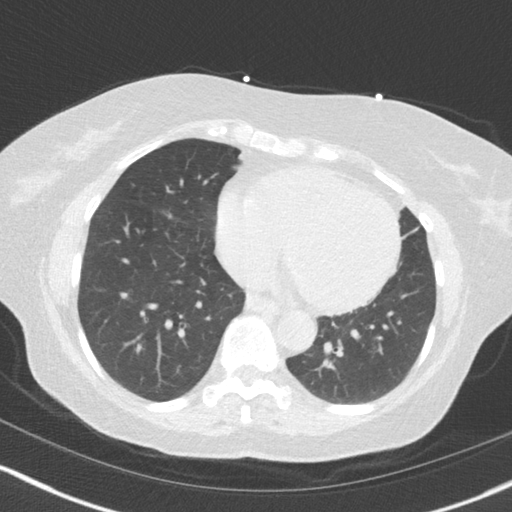
[im 22/44  lung]
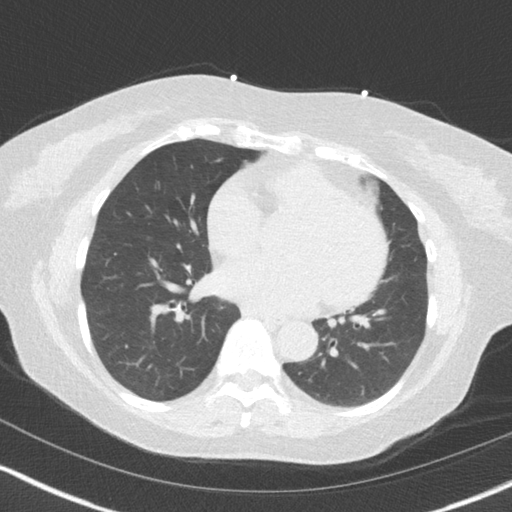
[im 29/44  lung]
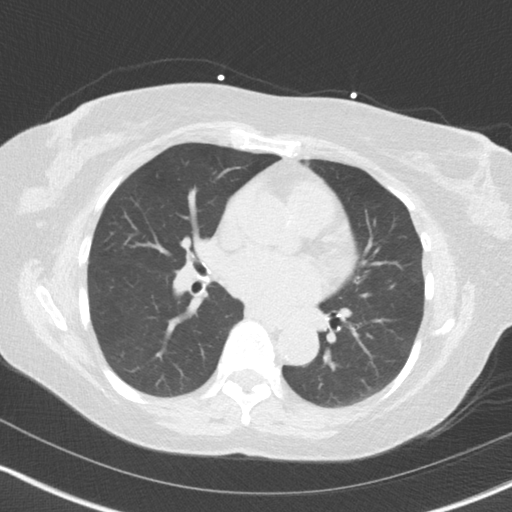
[im 36/44  lung]
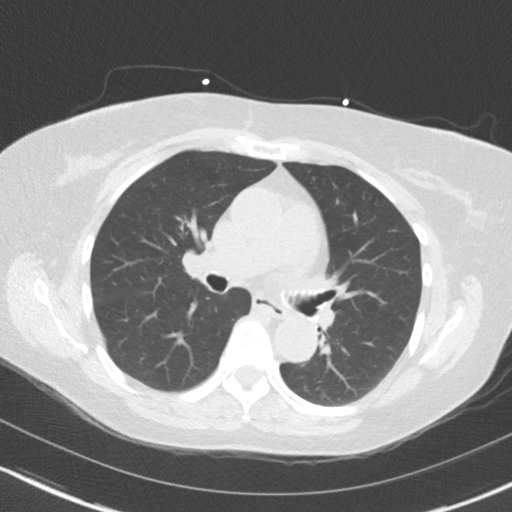

[14 of 20 positions shown; findings below may reference images not displayed]

FINDINGS: Aortic atherosclerosis. Within the visualized portions of the thorax
there are no suspicious appearing pulmonary nodules or masses, there
is no acute consolidative airspace disease, no pleural effusions, no
pneumothorax and no lymphadenopathy. Visualized portions of the
upper abdomen are unremarkable. There are no aggressive appearing
lytic or blastic lesions noted in the visualized portions of the
skeleton.
IMPRESSION: 1.  Aortic Atherosclerosis (1JTO3-7NX.X).
FINDINGS: Non-cardiac: See separate report from [REDACTED].

Ascending Aorta: Normal.  Atherosclerosis in the ascending aorta.

Pericardium: Normal

Coronary arteries: Normal origin
IMPRESSION: Coronary calcium score of 2. This was 12 percentile for age and sex
matched control.

Nylma Nismal,DO

*** End of Addendum ***
EXAM:
OVER-READ INTERPRETATION  CT CHEST

The following report is an over-read performed by radiologist Dr.
Hamish Eric Waltho [REDACTED] on 02/19/2020. This
over-read does not include interpretation of cardiac or coronary
anatomy or pathology. The coronary calcium score interpretation by
the cardiologist is attached.
FINDINGS: Aortic atherosclerosis. Within the visualized portions of the thorax
there are no suspicious appearing pulmonary nodules or masses, there
is no acute consolidative airspace disease, no pleural effusions, no
pneumothorax and no lymphadenopathy. Visualized portions of the
upper abdomen are unremarkable. There are no aggressive appearing
lytic or blastic lesions noted in the visualized portions of the
skeleton.
IMPRESSION: 1.  Aortic Atherosclerosis (1JTO3-7NX.X).

## 2021-02-05 DIAGNOSIS — L821 Other seborrheic keratosis: Secondary | ICD-10-CM | POA: Diagnosis not present

## 2021-02-05 DIAGNOSIS — L728 Other follicular cysts of the skin and subcutaneous tissue: Secondary | ICD-10-CM | POA: Diagnosis not present

## 2021-02-05 DIAGNOSIS — L700 Acne vulgaris: Secondary | ICD-10-CM | POA: Diagnosis not present

## 2021-02-05 DIAGNOSIS — L820 Inflamed seborrheic keratosis: Secondary | ICD-10-CM | POA: Diagnosis not present

## 2021-02-08 DIAGNOSIS — S60351A Superficial foreign body of right thumb, initial encounter: Secondary | ICD-10-CM | POA: Diagnosis not present

## 2021-02-18 DIAGNOSIS — Z6829 Body mass index (BMI) 29.0-29.9, adult: Secondary | ICD-10-CM | POA: Diagnosis not present

## 2021-02-18 DIAGNOSIS — E039 Hypothyroidism, unspecified: Secondary | ICD-10-CM | POA: Diagnosis not present

## 2021-02-24 DIAGNOSIS — Z8249 Family history of ischemic heart disease and other diseases of the circulatory system: Secondary | ICD-10-CM | POA: Diagnosis not present

## 2021-02-24 DIAGNOSIS — I6523 Occlusion and stenosis of bilateral carotid arteries: Secondary | ICD-10-CM | POA: Diagnosis not present

## 2021-02-24 DIAGNOSIS — I700 Atherosclerosis of aorta: Secondary | ICD-10-CM | POA: Diagnosis not present

## 2021-02-24 DIAGNOSIS — I6529 Occlusion and stenosis of unspecified carotid artery: Secondary | ICD-10-CM | POA: Diagnosis not present

## 2021-02-25 ENCOUNTER — Other Ambulatory Visit: Payer: Self-pay | Admitting: Cardiology

## 2021-03-05 DIAGNOSIS — E039 Hypothyroidism, unspecified: Secondary | ICD-10-CM | POA: Diagnosis not present

## 2021-03-05 DIAGNOSIS — I10 Essential (primary) hypertension: Secondary | ICD-10-CM | POA: Diagnosis not present

## 2021-03-05 DIAGNOSIS — E785 Hyperlipidemia, unspecified: Secondary | ICD-10-CM | POA: Diagnosis not present

## 2021-03-31 ENCOUNTER — Other Ambulatory Visit: Payer: Self-pay | Admitting: Cardiology

## 2021-04-14 DIAGNOSIS — E039 Hypothyroidism, unspecified: Secondary | ICD-10-CM | POA: Diagnosis not present

## 2021-04-29 DIAGNOSIS — Z20828 Contact with and (suspected) exposure to other viral communicable diseases: Secondary | ICD-10-CM | POA: Diagnosis not present

## 2021-04-29 DIAGNOSIS — J069 Acute upper respiratory infection, unspecified: Secondary | ICD-10-CM | POA: Diagnosis not present

## 2021-04-29 DIAGNOSIS — R051 Acute cough: Secondary | ICD-10-CM | POA: Diagnosis not present

## 2021-05-12 ENCOUNTER — Other Ambulatory Visit: Payer: Self-pay

## 2021-05-14 ENCOUNTER — Other Ambulatory Visit: Payer: Self-pay

## 2021-05-14 ENCOUNTER — Encounter: Payer: Self-pay | Admitting: Cardiology

## 2021-05-14 ENCOUNTER — Ambulatory Visit: Payer: PPO | Admitting: Cardiology

## 2021-05-14 VITALS — BP 116/60 | HR 71 | Ht 62.0 in | Wt 143.2 lb

## 2021-05-14 DIAGNOSIS — E785 Hyperlipidemia, unspecified: Secondary | ICD-10-CM | POA: Diagnosis not present

## 2021-05-14 DIAGNOSIS — I6529 Occlusion and stenosis of unspecified carotid artery: Secondary | ICD-10-CM

## 2021-05-14 DIAGNOSIS — I10 Essential (primary) hypertension: Secondary | ICD-10-CM

## 2021-05-14 DIAGNOSIS — I6523 Occlusion and stenosis of bilateral carotid arteries: Secondary | ICD-10-CM | POA: Diagnosis not present

## 2021-05-14 DIAGNOSIS — I700 Atherosclerosis of aorta: Secondary | ICD-10-CM

## 2021-05-14 HISTORY — DX: Occlusion and stenosis of unspecified carotid artery: I65.29

## 2021-05-14 NOTE — Patient Instructions (Signed)
Medication Instructions:  ?Your physician recommends that you continue on your current medications as directed. Please refer to the Current Medication list given to you today. ? ?*If you need a refill on your cardiac medications before your next appointment, please call your pharmacy* ? ? ?Lab Work: ?Your physician recommends that you return for lab work in: In next few days for fasting blood work. Lab opens at 8am, you do not need appointment ? ?If you have labs (blood work) drawn today and your tests are completely normal, you will receive your results only by: ?MyChart Message (if you have MyChart) OR ?A paper copy in the mail ?If you have any lab test that is abnormal or we need to change your treatment, we will call you to review the results. ? ? ?Testing/Procedures: ?NONE ? ? ?Follow-Up: ?At [facility], you and your health needs are our priority.  As part of our continuing mission to provide you with exceptional heart care, we have created designated Provider Care Teams.  These Care Teams include your primary Cardiologist (physician) and Advanced Practice Providers (APPs -  Physician Assistants and Nurse Practitioners) who all work together to provide you with the care you need, when you need it. ? ?We recommend signing up for the patient portal called "MyChart".  Sign up information is provided on this After Visit Summary.  MyChart is used to connect with patients for Virtual Visits (Telemedicine).  Patients are able to view lab/test results, encounter notes, upcoming appointments, etc.  Non-urgent messages can be sent to your provider as well.   ?To learn more about what you can do with MyChart, go to [email or web address].   ? ?Your next appointment:   ?9 month(s) ? ?The format for your next appointment:   ?In Person ? ?Provider:   ?Jyl Heinz, MD  ? ? ?Other Instructions ?  ?

## 2021-05-14 NOTE — Progress Notes (Signed)
?Cardiology Office Note:   ? ?Date:  05/14/2021  ? ?ID:  Jaime Sutton, DOB November 16, 1945, MRN [id number] ? ?PCP:  Philemon Kingdom, MD  ?Cardiologist:  Garwin Brothers, MD  ? ?Referring MD: Philemon Kingdom, MD  ? ? ?ASSESSMENT:   ? ?1. Aortic atherosclerosis (HCC)   ?2. Essential hypertension   ?3. Dyslipidemia   ?4. Atherosclerosis of both carotid arteries   ? ?PLAN:   ? ?In order of problems listed above: ? ?Aortic atherosclerosis: Secondary prevention stressed with the patient.  Importance of compliance with diet medication stressed and she vocalized understanding.  She was advised to walk at least half an hour a day 5 days a week and she promises to do so. ?Essential hypertension: Blood pressure stable and diet was emphasized. ?Carotid atherosclerosis: [facility] hospital report was reviewed and discussed with the patient.  She has significant nonobstructive atherosclerosis. ?Mixed dyslipidemia: Diet was emphasized.  Lifestyle modification urged.  Blood work was done in the month of November.  She is advised to come back in the next few days for fasting blood work.  Her goal LDL is less than 70 and I may have to change her to rosuvastatin to get the optimal results. ?Patient will be seen in follow-up appointment in 6 months or earlier if the patient has any concerns ? ? ? ?Medication Adjustments/Labs and Tests Ordered: ?Current medicines are reviewed at length with the patient today.  Concerns regarding medicines are outlined above.  ?Orders Placed This Encounter  ?Procedures  ? Basic metabolic panel  ? Hepatic function panel  ? Lipid panel  ? EKG 12-Lead  ? ?No orders of the defined types were placed in this encounter. ? ? ? ?No chief complaint on file. ?  ? ?History of Present Illness:   ? ?Jaime Sutton is a 76 y.o. female.  Patient has past medical history of aortic and bilateral carotid atherosclerosis.  She has history of hypertension and dyslipidemia.  She denies any problems at this time and takes  care of activities of daily living.  No chest pain orthopnea or PND.  At the time of my evaluation, the patient is alert awake oriented and in no distress. ? ?Past Medical History:  ?Diagnosis Date  ? Aortic atherosclerosis (HCC) 03/12/2020  ? Bilateral carotid bruits 11/10/2015  ? Dyslipidemia 11/10/2015  ? Essential hypertension 05/09/2016  ? Thyroid disease   ? Traumatic avulsion of nail plate of toe 8/41/6606  ? ? ?Past Surgical History:  ?Procedure Laterality Date  ? ABDOMINAL HYSTERECTOMY    ? APPENDECTOMY    ? BLADDER SURGERY    ? BREAST SURGERY    ? breast lift  ? CORONARY ANGIOPLASTY WITH STENT PLACEMENT  10/06/2015  ? Left heart catheterization w/ intervention; surgeon: Merleen Milliner, MD,Location: [facility]  ? NASAL SEPTUM SURGERY    ? ROOT CANAL    ? ? ?Current Medications: ?Current Meds  ?Medication Sig  ? acyclovir (ZOVIRAX) 400 MG tablet Take 800 mg by mouth 4 (four) times daily as needed.  ? aspirin EC 81 MG tablet Take 81 mg by mouth every other day.  ? benzonatate (TESSALON) 100 MG capsule Take 100 mg by mouth 3 (three) times daily as needed.  ? calcium-vitamin D (OSCAL WITH D) 500-200 MG-UNIT TABS tablet Take 1 tablet by mouth daily.  ? cyclobenzaprine (FLEXERIL) 5 MG tablet Take 5 mg by mouth 2 (two) times daily.  ? gabapentin (NEURONTIN) 100 MG capsule Take 100 mg by mouth as needed  for pain (pain).  ? levothyroxine (SYNTHROID) 112 MCG tablet Take 112 mcg by mouth every morning.  ? losartan (COZAAR) 100 MG tablet Take 1 tablet (100 mg total) by mouth daily.  ? meloxicam (MOBIC) 15 MG tablet Take 15 mg by mouth daily.  ? Misc Natural Products (GLUCOSAMINE CHOND CMP TRIPLE) TABS Take 1,500 mg by mouth daily.  ? nitrofurantoin, macrocrystal-monohydrate, (MACROBID) 100 MG capsule Take 100 mg by mouth every 12 (twelve) hours.  ? Omega-3 1000 MG CAPS Take 1,000 mg by mouth daily.  ? simvastatin (ZOCOR) 40 MG tablet TAKE 1 TABLET(40 MG) BY MOUTH DAILY AT 6 PM  ?  ? ?Allergies:   Codeine  ? ?Social History   ? ?Socioeconomic History  ? Marital status: Unknown  ?  Spouse name: Not on file  ? Number of children: Not on file  ? Years of education: Not on file  ? Highest education level: Not on file  ?Occupational History  ? Not on file  ?Tobacco Use  ? Smoking status: Never  ? Smokeless tobacco: Never  ?Substance and Sexual Activity  ? Alcohol use: Yes  ?  Alcohol/week: 0.0 standard drinks  ? Drug use: No  ? Sexual activity: Not on file  ?Other Topics Concern  ? Not on file  ?Social History Narrative  ? Not on file  ? ?Social Determinants of Health  ? ?Financial Resource Strain: Not on file  ?Food Insecurity: Not on file  ?Transportation Needs: Not on file  ?Physical Activity: Not on file  ?Stress: Not on file  ?Social Connections: Not on file  ?  ? ?Family History: ?The patient's family history includes Aneurysm in her sister; Cancer in her sister; Heart attack in her sister; Heart disease in her father; Heart failure in her mother and sister. ? ?ROS:   ?Please see the history of present illness.    ?All other systems reviewed and are negative. ? ?EKGs/Labs/Other Studies Reviewed:   ? ?The following studies were reviewed today: ?I discussed my findings with the patient at length. ? ? ?Recent Labs: ?No results found for requested labs within last 8760 hours.  ?Recent Lipid Panel ?   ?Component Value Date/Time  ? CHOL 212 (H) 03/04/2020 0354  ? TRIG 118 03/04/2020 0811  ? HDL 63 03/04/2020 0811  ? CHOLHDL 3.4 03/04/2020 0811  ? LDLCALC 128 (H) 03/04/2020 6568  ? ? ?Physical Exam:   ? ?VS:  BP 116/60   Pulse 71   Ht 5\' 2"  (1.575 m)   Wt 143 lb 3.2 oz (65 kg)   SpO2 97%   BMI 26.19 kg/m?    ? ?Wt Readings from Last 3 Encounters:  ?05/14/21 143 lb 3.2 oz (65 kg)  ?11/11/20 146 lb (66.2 kg)  ?05/12/20 144 lb 3.2 oz (65.4 kg)  ?  ? ?GEN: Patient is in no acute distress ?HEENT: Normal ?NECK: No JVD; No carotid bruits ?LYMPHATICS: No lymphadenopathy ?CARDIAC: Hear sounds regular, 2/6 systolic murmur at the  apex. ?RESPIRATORY:  Clear to auscultation without rales, wheezing or rhonchi  ?ABDOMEN: Soft, non-tender, non-distended ?MUSCULOSKELETAL:  No edema; No deformity  ?SKIN: Warm and dry ?NEUROLOGIC:  Alert and oriented x 3 ?PSYCHIATRIC:  Normal affect  ? ?Signed, ?07/12/20, MD  ?05/14/2021 10:55 AM    ?[facility]  ?

## 2021-06-01 DIAGNOSIS — E785 Hyperlipidemia, unspecified: Secondary | ICD-10-CM | POA: Diagnosis not present

## 2021-06-01 DIAGNOSIS — I10 Essential (primary) hypertension: Secondary | ICD-10-CM | POA: Diagnosis not present

## 2021-06-01 DIAGNOSIS — I700 Atherosclerosis of aorta: Secondary | ICD-10-CM | POA: Diagnosis not present

## 2021-06-02 LAB — BASIC METABOLIC PANEL
BUN/Creatinine Ratio: 19 (ref 12–28)
BUN: 20 mg/dL (ref 8–27)
CO2: 28 mmol/L (ref 20–29)
Calcium: 9.9 mg/dL (ref 8.7–10.3)
Chloride: 107 mmol/L — ABNORMAL HIGH (ref 96–106)
Creatinine, Ser: 1.06 mg/dL — ABNORMAL HIGH (ref 0.57–1.00)
Glucose: 91 mg/dL (ref 70–99)
Potassium: 4.8 mmol/L (ref 3.5–5.2)
Sodium: 145 mmol/L — ABNORMAL HIGH (ref 134–144)
eGFR: 55 mL/min/{1.73_m2} — ABNORMAL LOW (ref >59)

## 2021-06-02 LAB — HEPATIC FUNCTION PANEL
ALT: 13 IU/L (ref 0–32)
AST: 21 IU/L (ref 0–40)
Albumin: 4.4 g/dL (ref 3.7–4.7)
Alkaline Phosphatase: 64 IU/L (ref 44–121)
Bilirubin Total: 0.6 mg/dL (ref 0.0–1.2)
Bilirubin, Direct: 0.17 mg/dL (ref 0.00–0.40)
Total Protein: 6.6 g/dL (ref 6.0–8.5)

## 2021-06-02 LAB — LIPID PANEL
Chol/HDL Ratio: 2.9 ratio (ref 0.0–4.4)
Cholesterol, Total: 129 mg/dL (ref 100–199)
HDL: 44 mg/dL (ref >39)
LDL Chol Calc (NIH): 70 mg/dL (ref 0–99)
Triglycerides: 74 mg/dL (ref 0–149)
VLDL Cholesterol Cal: 15 mg/dL (ref 5–40)

## 2021-06-04 DIAGNOSIS — E039 Hypothyroidism, unspecified: Secondary | ICD-10-CM | POA: Diagnosis not present

## 2021-06-04 DIAGNOSIS — I10 Essential (primary) hypertension: Secondary | ICD-10-CM | POA: Diagnosis not present

## 2021-06-04 DIAGNOSIS — E785 Hyperlipidemia, unspecified: Secondary | ICD-10-CM | POA: Diagnosis not present

## 2021-07-13 DIAGNOSIS — E039 Hypothyroidism, unspecified: Secondary | ICD-10-CM | POA: Diagnosis not present

## 2021-08-03 DIAGNOSIS — D509 Iron deficiency anemia, unspecified: Secondary | ICD-10-CM | POA: Diagnosis not present

## 2021-08-03 DIAGNOSIS — J302 Other seasonal allergic rhinitis: Secondary | ICD-10-CM | POA: Diagnosis not present

## 2021-08-03 DIAGNOSIS — N390 Urinary tract infection, site not specified: Secondary | ICD-10-CM | POA: Diagnosis not present

## 2021-08-03 DIAGNOSIS — E785 Hyperlipidemia, unspecified: Secondary | ICD-10-CM | POA: Diagnosis not present

## 2021-08-03 DIAGNOSIS — I700 Atherosclerosis of aorta: Secondary | ICD-10-CM | POA: Diagnosis not present

## 2021-08-03 DIAGNOSIS — Z79899 Other long term (current) drug therapy: Secondary | ICD-10-CM | POA: Diagnosis not present

## 2021-08-03 DIAGNOSIS — Z6826 Body mass index (BMI) 26.0-26.9, adult: Secondary | ICD-10-CM | POA: Diagnosis not present

## 2021-08-03 DIAGNOSIS — I10 Essential (primary) hypertension: Secondary | ICD-10-CM | POA: Diagnosis not present

## 2021-08-03 DIAGNOSIS — E039 Hypothyroidism, unspecified: Secondary | ICD-10-CM | POA: Diagnosis not present

## 2021-09-03 DIAGNOSIS — E785 Hyperlipidemia, unspecified: Secondary | ICD-10-CM | POA: Diagnosis not present

## 2021-09-03 DIAGNOSIS — E039 Hypothyroidism, unspecified: Secondary | ICD-10-CM | POA: Diagnosis not present

## 2021-09-03 DIAGNOSIS — I10 Essential (primary) hypertension: Secondary | ICD-10-CM | POA: Diagnosis not present

## 2021-10-11 DIAGNOSIS — E039 Hypothyroidism, unspecified: Secondary | ICD-10-CM | POA: Diagnosis not present

## 2021-11-04 DIAGNOSIS — I10 Essential (primary) hypertension: Secondary | ICD-10-CM | POA: Diagnosis not present

## 2021-11-04 DIAGNOSIS — E039 Hypothyroidism, unspecified: Secondary | ICD-10-CM | POA: Diagnosis not present

## 2021-11-04 DIAGNOSIS — E785 Hyperlipidemia, unspecified: Secondary | ICD-10-CM | POA: Diagnosis not present

## 2021-11-19 ENCOUNTER — Other Ambulatory Visit: Payer: Self-pay | Admitting: Cardiology

## 2022-02-09 DIAGNOSIS — I700 Atherosclerosis of aorta: Secondary | ICD-10-CM | POA: Diagnosis not present

## 2022-02-09 DIAGNOSIS — E2839 Other primary ovarian failure: Secondary | ICD-10-CM | POA: Diagnosis not present

## 2022-02-09 DIAGNOSIS — R350 Frequency of micturition: Secondary | ICD-10-CM | POA: Diagnosis not present

## 2022-02-09 DIAGNOSIS — Z1231 Encounter for screening mammogram for malignant neoplasm of breast: Secondary | ICD-10-CM | POA: Diagnosis not present

## 2022-02-09 DIAGNOSIS — E785 Hyperlipidemia, unspecified: Secondary | ICD-10-CM | POA: Diagnosis not present

## 2022-02-09 DIAGNOSIS — I10 Essential (primary) hypertension: Secondary | ICD-10-CM | POA: Diagnosis not present

## 2022-02-09 DIAGNOSIS — Z1331 Encounter for screening for depression: Secondary | ICD-10-CM | POA: Diagnosis not present

## 2022-02-09 DIAGNOSIS — Z79899 Other long term (current) drug therapy: Secondary | ICD-10-CM | POA: Diagnosis not present

## 2022-02-09 DIAGNOSIS — E039 Hypothyroidism, unspecified: Secondary | ICD-10-CM | POA: Diagnosis not present

## 2022-02-09 DIAGNOSIS — Z0000 Encounter for general adult medical examination without abnormal findings: Secondary | ICD-10-CM | POA: Diagnosis not present

## 2022-02-09 DIAGNOSIS — Z6829 Body mass index (BMI) 29.0-29.9, adult: Secondary | ICD-10-CM | POA: Diagnosis not present

## 2022-02-23 DIAGNOSIS — B009 Herpesviral infection, unspecified: Secondary | ICD-10-CM | POA: Diagnosis not present

## 2022-02-23 DIAGNOSIS — I10 Essential (primary) hypertension: Secondary | ICD-10-CM | POA: Diagnosis not present

## 2022-02-23 DIAGNOSIS — G4733 Obstructive sleep apnea (adult) (pediatric): Secondary | ICD-10-CM | POA: Diagnosis not present

## 2022-02-23 DIAGNOSIS — I2510 Atherosclerotic heart disease of native coronary artery without angina pectoris: Secondary | ICD-10-CM | POA: Diagnosis not present

## 2022-02-23 DIAGNOSIS — D509 Iron deficiency anemia, unspecified: Secondary | ICD-10-CM | POA: Diagnosis not present

## 2022-02-23 DIAGNOSIS — H259 Unspecified age-related cataract: Secondary | ICD-10-CM | POA: Diagnosis not present

## 2022-02-23 DIAGNOSIS — I700 Atherosclerosis of aorta: Secondary | ICD-10-CM | POA: Diagnosis not present

## 2022-02-23 DIAGNOSIS — G8929 Other chronic pain: Secondary | ICD-10-CM | POA: Diagnosis not present

## 2022-02-23 DIAGNOSIS — E663 Overweight: Secondary | ICD-10-CM | POA: Diagnosis not present

## 2022-02-23 DIAGNOSIS — E049 Nontoxic goiter, unspecified: Secondary | ICD-10-CM | POA: Diagnosis not present

## 2022-02-23 DIAGNOSIS — E785 Hyperlipidemia, unspecified: Secondary | ICD-10-CM | POA: Diagnosis not present

## 2022-02-23 DIAGNOSIS — E039 Hypothyroidism, unspecified: Secondary | ICD-10-CM | POA: Diagnosis not present

## 2022-03-01 ENCOUNTER — Other Ambulatory Visit: Payer: Self-pay

## 2022-03-02 ENCOUNTER — Ambulatory Visit: Payer: PPO | Attending: Cardiology | Admitting: Cardiology

## 2022-03-02 ENCOUNTER — Encounter: Payer: Self-pay | Admitting: Cardiology

## 2022-03-02 VITALS — BP 118/62 | HR 86 | Ht 62.0 in | Wt 167.6 lb

## 2022-03-02 DIAGNOSIS — I6529 Occlusion and stenosis of unspecified carotid artery: Secondary | ICD-10-CM

## 2022-03-02 DIAGNOSIS — E785 Hyperlipidemia, unspecified: Secondary | ICD-10-CM | POA: Diagnosis not present

## 2022-03-02 DIAGNOSIS — I700 Atherosclerosis of aorta: Secondary | ICD-10-CM

## 2022-03-02 DIAGNOSIS — I10 Essential (primary) hypertension: Secondary | ICD-10-CM | POA: Diagnosis not present

## 2022-03-02 NOTE — Patient Instructions (Signed)
Medication Instructions:  Your physician recommends that you continue on your current medications as directed. Please refer to the Current Medication list given to you today.  *If you need a refill on your cardiac medications before your next appointment, please call your pharmacy*   Lab Work: None ordered If you have labs (blood work) drawn today and your tests are completely normal, you will receive your results only by: MyChart Message (if you have MyChart) OR A paper copy in the mail If you have any lab test that is abnormal or we need to change your treatment, we will call you to review the results.   Testing/Procedures: None ordered   Follow-Up: At [facility], you and your health needs are our priority.  As part of our continuing mission to provide you with exceptional heart care, we have created designated Provider Care Teams.  These Care Teams include your primary Cardiologist (physician) and Advanced Practice Providers (APPs -  Physician Assistants and Nurse Practitioners) who all work together to provide you with the care you need, when you need it.  We recommend signing up for the patient portal called "MyChart".  Sign up information is provided on this After Visit Summary.  MyChart is used to connect with patients for Virtual Visits (Telemedicine).  Patients are able to view lab/test results, encounter notes, upcoming appointments, etc.  Non-urgent messages can be sent to your provider as well.   To learn more about what you can do with MyChart, go to [email or web address].    Your next appointment:   12 month(s)  The format for your next appointment:   In Person  Provider:   Rajan Revankar, MD    Other Instructions none  Important Information About Sugar      

## 2022-03-02 NOTE — Progress Notes (Signed)
Cardiology Office Note:    Date:  03/02/2022   ID:  Jaime Sutton, DOB 1946-01-18, MRN [id number]  PCP:  Jaime Kiel, MD  Cardiologist:  Jaime Lindau, MD   Referring MD: Jaime Kiel, MD    ASSESSMENT:    1. Aortic atherosclerosis (HCC)   2. Carotid atherosclerosis, unspecified laterality   3. Essential hypertension   4. Dyslipidemia    PLAN:    In order of problems listed above:  Aortic atherosclerosis: Secondary prevention stressed to the patient.  Importance of compliance with diet medication stressed and she vocalized understanding. Essential hypertension: Blood pressure is stable and diet was emphasized. Mixed dyslipidemia: On lipid-lowering medications followed by primary care.  Lipids reviewed and discussed with the patient. Obesity: She has gained weight.  She tells me that she lost her dog of 16 years.  She is trying to cope with this.  Diet emphasized.  Weight reduction stressed risks of obesity explained and she promises to do better. Patient will be seen in follow-up appointment in 12 months or earlier if the patient has any concerns    Medication Adjustments/Labs and Tests Ordered: Current medicines are reviewed at length with the patient today.  Concerns regarding medicines are outlined above.  No orders of the defined types were placed in this encounter.  No orders of the defined types were placed in this encounter.    No chief complaint on file.    History of Present Illness:    Jaime Sutton is a 76 y.o. female.  Patient has past medical history of aortic and carotid atherosclerosis.  She has history of mixed dyslipidemia.  She denies any problems at this time and takes care of activities of daily living.  She leads a sedentary lifestyle because of orthopedic issues involving her neck.  She does not plan to undergo any neck surgery.  At the time of my evaluation, the patient is alert awake oriented and in no distress.  Past Medical  History:  Diagnosis Date   Aortic atherosclerosis (Jaime Sutton) 03/12/2020   Bilateral carotid bruits 11/10/2015   Carotid atherosclerosis 05/14/2021   Dyslipidemia 11/10/2015   Essential hypertension 05/09/2016   Thyroid disease    Traumatic avulsion of nail plate of toe [id number]    Past Surgical History:  Procedure Laterality Date   ABDOMINAL HYSTERECTOMY     APPENDECTOMY     BLADDER SURGERY     BREAST SURGERY     breast lift   CORONARY ANGIOPLASTY WITH STENT PLACEMENT  10/06/2015   Left heart catheterization w/ intervention; surgeon: Jaime Patten, MD,Location: [facility]   NASAL SEPTUM SURGERY     ROOT CANAL      Current Medications: Current Meds  Medication Sig   acyclovir (ZOVIRAX) 400 MG tablet Take 800 mg by mouth 4 (four) times daily as needed (cold sore outbreaks).   aspirin EC 81 MG tablet Take 81 mg by mouth every other day.   calcium-vitamin D (OSCAL WITH D) 500-200 MG-UNIT TABS tablet Take 1 tablet by mouth daily.   gabapentin (NEURONTIN) 100 MG capsule Take 100 mg by mouth as needed for pain (pain).   gabapentin (NEURONTIN) 300 MG capsule Take 300 mg by mouth 3 (three) times daily as needed (pain).   levothyroxine (SYNTHROID) 100 MCG tablet Take 100 mcg by mouth daily before breakfast.   losartan (COZAAR) 100 MG tablet Take 1 tablet (100 mg total) by mouth daily.   meloxicam (MOBIC) 15 MG tablet Take 15 mg by mouth  daily.   Omega-3 1000 MG CAPS Take 1,000 mg by mouth daily.   simvastatin (ZOCOR) 40 MG tablet TAKE 1 TABLET(40 MG) BY MOUTH DAILY AT 6 PM     Allergies:   Codeine   Social History   Socioeconomic History   Marital status: Unknown    Spouse name: Not on file   Number of children: Not on file   Years of education: Not on file   Highest education level: Not on file  Occupational History   Not on file  Tobacco Use   Smoking status: Never   Smokeless tobacco: Never  Substance and Sexual Activity   Alcohol use: Yes    Alcohol/week: 0.0 standard  drinks of alcohol   Drug use: No   Sexual activity: Not on file  Other Topics Concern   Not on file  Social History Narrative   Not on file   Social Determinants of Health   Financial Resource Strain: Not on file  Food Insecurity: Not on file  Transportation Needs: Not on file  Physical Activity: Not on file  Stress: Not on file  Social Connections: Not on file     Family History: The patient's family history includes Aneurysm in her sister; Cancer in her sister; Heart attack in her sister; Heart disease in her father; Heart failure in her mother and sister.  ROS:   Please see the history of present illness.    All other systems reviewed and are negative.  EKGs/Labs/Other Studies Reviewed:    The following studies were reviewed today: EKG reveals sinus rhythm and nonspecific ST-T changes   Recent Labs: 06/01/2021: ALT 13; BUN 20; Creatinine, Ser 1.06; Potassium 4.8; Sodium 145  Recent Lipid Panel    Component Value Date/Time   CHOL 129 06/01/2021 0819   TRIG 74 06/01/2021 0819   HDL 44 06/01/2021 0819   CHOLHDL 2.9 06/01/2021 0819   LDLCALC 70 06/01/2021 0819    Physical Exam:    VS:  BP 118/62   Pulse 86   Ht 5\' 2"  (1.575 m)   Wt 167 lb 9.6 oz (76 kg)   SpO2 94%   BMI 30.65 kg/m     Wt Readings from Last 3 Encounters:  03/02/22 167 lb 9.6 oz (76 kg)  05/14/21 143 lb 3.2 oz (65 kg)  11/11/20 146 lb (66.2 kg)     GEN: Patient is in no acute distress HEENT: Normal NECK: No JVD; No carotid bruits LYMPHATICS: No lymphadenopathy CARDIAC: Hear sounds regular, 2/6 systolic murmur at the apex. RESPIRATORY:  Clear to auscultation without rales, wheezing or rhonchi  ABDOMEN: Soft, non-tender, non-distended MUSCULOSKELETAL:  No edema; No deformity  SKIN: Warm and dry NEUROLOGIC:  Alert and oriented x 3 PSYCHIATRIC:  Normal affect   Signed, 01/11/21, MD  03/02/2022 3:40 PM    Jaime Sutton

## 2022-03-08 DIAGNOSIS — R635 Abnormal weight gain: Secondary | ICD-10-CM | POA: Diagnosis not present

## 2022-03-08 DIAGNOSIS — M5136 Other intervertebral disc degeneration, lumbar region: Secondary | ICD-10-CM | POA: Diagnosis not present

## 2022-03-08 DIAGNOSIS — E039 Hypothyroidism, unspecified: Secondary | ICD-10-CM | POA: Diagnosis not present

## 2022-03-08 DIAGNOSIS — I10 Essential (primary) hypertension: Secondary | ICD-10-CM | POA: Diagnosis not present

## 2022-03-08 DIAGNOSIS — R9431 Abnormal electrocardiogram [ECG] [EKG]: Secondary | ICD-10-CM | POA: Diagnosis not present

## 2022-03-08 DIAGNOSIS — R32 Unspecified urinary incontinence: Secondary | ICD-10-CM | POA: Diagnosis not present

## 2022-03-08 DIAGNOSIS — E782 Mixed hyperlipidemia: Secondary | ICD-10-CM | POA: Diagnosis not present

## 2022-03-08 DIAGNOSIS — M5030 Other cervical disc degeneration, unspecified cervical region: Secondary | ICD-10-CM | POA: Diagnosis not present

## 2022-03-08 DIAGNOSIS — I6529 Occlusion and stenosis of unspecified carotid artery: Secondary | ICD-10-CM | POA: Diagnosis not present

## 2022-03-10 DIAGNOSIS — Z79899 Other long term (current) drug therapy: Secondary | ICD-10-CM | POA: Diagnosis not present

## 2022-04-08 DIAGNOSIS — Z1231 Encounter for screening mammogram for malignant neoplasm of breast: Secondary | ICD-10-CM | POA: Diagnosis not present

## 2022-04-08 DIAGNOSIS — E2839 Other primary ovarian failure: Secondary | ICD-10-CM | POA: Diagnosis not present

## 2022-04-08 DIAGNOSIS — M85851 Other specified disorders of bone density and structure, right thigh: Secondary | ICD-10-CM | POA: Diagnosis not present

## 2022-04-22 DIAGNOSIS — R922 Inconclusive mammogram: Secondary | ICD-10-CM | POA: Diagnosis not present

## 2022-04-22 DIAGNOSIS — R928 Other abnormal and inconclusive findings on diagnostic imaging of breast: Secondary | ICD-10-CM | POA: Diagnosis not present

## 2022-04-25 DIAGNOSIS — N19 Unspecified kidney failure: Secondary | ICD-10-CM | POA: Diagnosis not present

## 2022-04-28 DIAGNOSIS — Z79899 Other long term (current) drug therapy: Secondary | ICD-10-CM | POA: Diagnosis not present

## 2022-04-28 DIAGNOSIS — E039 Hypothyroidism, unspecified: Secondary | ICD-10-CM | POA: Diagnosis not present

## 2022-05-05 DIAGNOSIS — M542 Cervicalgia: Secondary | ICD-10-CM | POA: Diagnosis not present

## 2022-05-05 DIAGNOSIS — B001 Herpesviral vesicular dermatitis: Secondary | ICD-10-CM | POA: Diagnosis not present

## 2022-05-05 DIAGNOSIS — M19011 Primary osteoarthritis, right shoulder: Secondary | ICD-10-CM | POA: Diagnosis not present

## 2022-05-05 DIAGNOSIS — E785 Hyperlipidemia, unspecified: Secondary | ICD-10-CM | POA: Diagnosis not present

## 2022-05-05 DIAGNOSIS — Z6830 Body mass index (BMI) 30.0-30.9, adult: Secondary | ICD-10-CM | POA: Diagnosis not present

## 2022-05-05 DIAGNOSIS — E039 Hypothyroidism, unspecified: Secondary | ICD-10-CM | POA: Diagnosis not present

## 2022-05-05 DIAGNOSIS — B372 Candidiasis of skin and nail: Secondary | ICD-10-CM | POA: Diagnosis not present

## 2022-05-05 DIAGNOSIS — M47816 Spondylosis without myelopathy or radiculopathy, lumbar region: Secondary | ICD-10-CM | POA: Diagnosis not present

## 2022-05-05 DIAGNOSIS — M47812 Spondylosis without myelopathy or radiculopathy, cervical region: Secondary | ICD-10-CM | POA: Diagnosis not present

## 2022-05-05 DIAGNOSIS — M25511 Pain in right shoulder: Secondary | ICD-10-CM | POA: Diagnosis not present

## 2022-05-05 DIAGNOSIS — I700 Atherosclerosis of aorta: Secondary | ICD-10-CM | POA: Diagnosis not present

## 2022-05-13 DIAGNOSIS — M4802 Spinal stenosis, cervical region: Secondary | ICD-10-CM | POA: Diagnosis not present

## 2022-05-13 DIAGNOSIS — R200 Anesthesia of skin: Secondary | ICD-10-CM | POA: Diagnosis not present

## 2022-05-13 DIAGNOSIS — M47812 Spondylosis without myelopathy or radiculopathy, cervical region: Secondary | ICD-10-CM | POA: Diagnosis not present

## 2022-05-16 ENCOUNTER — Other Ambulatory Visit: Payer: Self-pay | Admitting: Cardiology

## 2022-05-26 DIAGNOSIS — M5116 Intervertebral disc disorders with radiculopathy, lumbar region: Secondary | ICD-10-CM | POA: Diagnosis not present

## 2022-05-26 DIAGNOSIS — M4722 Other spondylosis with radiculopathy, cervical region: Secondary | ICD-10-CM | POA: Diagnosis not present

## 2022-05-26 DIAGNOSIS — M4312 Spondylolisthesis, cervical region: Secondary | ICD-10-CM | POA: Diagnosis not present

## 2022-06-10 DIAGNOSIS — M4312 Spondylolisthesis, cervical region: Secondary | ICD-10-CM | POA: Diagnosis not present

## 2022-08-03 DIAGNOSIS — E039 Hypothyroidism, unspecified: Secondary | ICD-10-CM | POA: Diagnosis not present

## 2022-08-08 DIAGNOSIS — H25811 Combined forms of age-related cataract, right eye: Secondary | ICD-10-CM | POA: Diagnosis not present

## 2022-08-10 DIAGNOSIS — M47812 Spondylosis without myelopathy or radiculopathy, cervical region: Secondary | ICD-10-CM | POA: Diagnosis not present

## 2022-08-10 DIAGNOSIS — R5383 Other fatigue: Secondary | ICD-10-CM | POA: Diagnosis not present

## 2022-08-10 DIAGNOSIS — M47816 Spondylosis without myelopathy or radiculopathy, lumbar region: Secondary | ICD-10-CM | POA: Diagnosis not present

## 2022-08-10 DIAGNOSIS — I10 Essential (primary) hypertension: Secondary | ICD-10-CM | POA: Diagnosis not present

## 2022-08-10 DIAGNOSIS — E669 Obesity, unspecified: Secondary | ICD-10-CM | POA: Diagnosis not present

## 2022-08-10 DIAGNOSIS — R0683 Snoring: Secondary | ICD-10-CM | POA: Diagnosis not present

## 2022-08-10 DIAGNOSIS — E039 Hypothyroidism, unspecified: Secondary | ICD-10-CM | POA: Diagnosis not present

## 2022-08-10 DIAGNOSIS — E785 Hyperlipidemia, unspecified: Secondary | ICD-10-CM | POA: Diagnosis not present

## 2022-08-10 DIAGNOSIS — I700 Atherosclerosis of aorta: Secondary | ICD-10-CM | POA: Diagnosis not present

## 2022-09-16 DIAGNOSIS — H40033 Anatomical narrow angle, bilateral: Secondary | ICD-10-CM | POA: Diagnosis not present

## 2022-09-16 DIAGNOSIS — H52201 Unspecified astigmatism, right eye: Secondary | ICD-10-CM | POA: Diagnosis not present

## 2022-09-16 DIAGNOSIS — H2511 Age-related nuclear cataract, right eye: Secondary | ICD-10-CM | POA: Diagnosis not present

## 2022-09-16 DIAGNOSIS — Z01818 Encounter for other preprocedural examination: Secondary | ICD-10-CM | POA: Diagnosis not present

## 2022-10-03 DIAGNOSIS — E039 Hypothyroidism, unspecified: Secondary | ICD-10-CM | POA: Diagnosis not present

## 2022-10-03 DIAGNOSIS — Z79899 Other long term (current) drug therapy: Secondary | ICD-10-CM | POA: Diagnosis not present

## 2022-10-04 DIAGNOSIS — H40033 Anatomical narrow angle, bilateral: Secondary | ICD-10-CM | POA: Diagnosis not present

## 2022-10-04 DIAGNOSIS — H259 Unspecified age-related cataract: Secondary | ICD-10-CM | POA: Diagnosis not present

## 2022-10-04 DIAGNOSIS — I10 Essential (primary) hypertension: Secondary | ICD-10-CM | POA: Diagnosis not present

## 2022-10-04 DIAGNOSIS — H2511 Age-related nuclear cataract, right eye: Secondary | ICD-10-CM | POA: Diagnosis not present

## 2022-10-30 DIAGNOSIS — R110 Nausea: Secondary | ICD-10-CM | POA: Diagnosis not present

## 2022-10-30 DIAGNOSIS — R0981 Nasal congestion: Secondary | ICD-10-CM | POA: Diagnosis not present

## 2022-10-30 DIAGNOSIS — R519 Headache, unspecified: Secondary | ICD-10-CM | POA: Diagnosis not present

## 2022-12-15 DIAGNOSIS — E039 Hypothyroidism, unspecified: Secondary | ICD-10-CM | POA: Diagnosis not present

## 2023-02-13 DIAGNOSIS — R5383 Other fatigue: Secondary | ICD-10-CM | POA: Diagnosis not present

## 2023-02-13 DIAGNOSIS — Z0000 Encounter for general adult medical examination without abnormal findings: Secondary | ICD-10-CM | POA: Diagnosis not present

## 2023-02-13 DIAGNOSIS — E785 Hyperlipidemia, unspecified: Secondary | ICD-10-CM | POA: Diagnosis not present

## 2023-02-13 DIAGNOSIS — I10 Essential (primary) hypertension: Secondary | ICD-10-CM | POA: Diagnosis not present

## 2023-02-13 DIAGNOSIS — Z79899 Other long term (current) drug therapy: Secondary | ICD-10-CM | POA: Diagnosis not present

## 2023-02-13 DIAGNOSIS — D649 Anemia, unspecified: Secondary | ICD-10-CM | POA: Diagnosis not present

## 2023-02-13 DIAGNOSIS — E039 Hypothyroidism, unspecified: Secondary | ICD-10-CM | POA: Diagnosis not present

## 2023-02-13 DIAGNOSIS — R0683 Snoring: Secondary | ICD-10-CM | POA: Diagnosis not present

## 2023-02-13 DIAGNOSIS — Z1331 Encounter for screening for depression: Secondary | ICD-10-CM | POA: Diagnosis not present

## 2023-02-13 DIAGNOSIS — N3946 Mixed incontinence: Secondary | ICD-10-CM | POA: Diagnosis not present

## 2023-02-13 DIAGNOSIS — Z6828 Body mass index (BMI) 28.0-28.9, adult: Secondary | ICD-10-CM | POA: Diagnosis not present

## 2023-02-13 DIAGNOSIS — F5101 Primary insomnia: Secondary | ICD-10-CM | POA: Diagnosis not present

## 2023-03-30 DIAGNOSIS — E039 Hypothyroidism, unspecified: Secondary | ICD-10-CM | POA: Diagnosis not present

## 2023-03-30 DIAGNOSIS — G4700 Insomnia, unspecified: Secondary | ICD-10-CM | POA: Diagnosis not present

## 2023-03-30 DIAGNOSIS — I10 Essential (primary) hypertension: Secondary | ICD-10-CM | POA: Diagnosis not present

## 2023-03-30 DIAGNOSIS — Z6828 Body mass index (BMI) 28.0-28.9, adult: Secondary | ICD-10-CM | POA: Diagnosis not present

## 2023-03-30 DIAGNOSIS — M47816 Spondylosis without myelopathy or radiculopathy, lumbar region: Secondary | ICD-10-CM | POA: Diagnosis not present

## 2023-03-30 DIAGNOSIS — E785 Hyperlipidemia, unspecified: Secondary | ICD-10-CM | POA: Diagnosis not present

## 2023-05-16 ENCOUNTER — Encounter: Payer: Self-pay | Admitting: Cardiology

## 2023-05-16 ENCOUNTER — Ambulatory Visit: Payer: PPO | Attending: Cardiology | Admitting: Cardiology

## 2023-05-16 VITALS — BP 110/68 | HR 82 | Ht 62.0 in | Wt 154.4 lb

## 2023-05-16 DIAGNOSIS — E782 Mixed hyperlipidemia: Secondary | ICD-10-CM | POA: Diagnosis not present

## 2023-05-16 DIAGNOSIS — I10 Essential (primary) hypertension: Secondary | ICD-10-CM | POA: Diagnosis not present

## 2023-05-16 DIAGNOSIS — I6529 Occlusion and stenosis of unspecified carotid artery: Secondary | ICD-10-CM | POA: Diagnosis not present

## 2023-05-16 DIAGNOSIS — I700 Atherosclerosis of aorta: Secondary | ICD-10-CM

## 2023-05-16 NOTE — Patient Instructions (Signed)
 Medication Instructions:  Your physician recommends that you continue on your current medications as directed. Please refer to the Current Medication list given to you today.  *If you need a refill on your cardiac medications before your next appointment, please call your pharmacy*   Lab Work: None Ordered If you have labs (blood work) drawn today and your tests are completely normal, you will receive your results only by: MyChart Message (if you have MyChart) OR A paper copy in the mail If you have any lab test that is abnormal or we need to change your treatment, we will call you to review the results.   Testing/Procedures: None Ordered   Follow-Up: At [facility], you and your health needs are our priority.  As part of our continuing mission to provide you with exceptional heart care, we have created designated Provider Care Teams.  These Care Teams include your primary Cardiologist (physician) and Advanced Practice Providers (APPs -  Physician Assistants and Nurse Practitioners) who all work together to provide you with the care you need, when you need it.  We recommend signing up for the patient portal called "MyChart".  Sign up information is provided on this After Visit Summary.  MyChart is used to connect with patients for Virtual Visits (Telemedicine).  Patients are able to view lab/test results, encounter notes, upcoming appointments, etc.  Non-urgent messages can be sent to your provider as well.   To learn more about what you can do with MyChart, go to [email or web address].    Your next appointment:   12 month follow up

## 2023-05-16 NOTE — Progress Notes (Signed)
 Cardiology Office Note:    Date:  05/16/2023   ID:  Jaime Sutton, DOB [birth year], MRN [id number]  PCP:  Philemon Kingdom, MD  Cardiologist:  Garwin Brothers, MD   Referring MD: Philemon Kingdom, MD    ASSESSMENT:    1. Essential hypertension   2. Aortic atherosclerosis (HCC)   3. Carotid atherosclerosis, unspecified laterality   4. Mixed dyslipidemia    PLAN:    In order of problems listed above:  Primary prevention stressed with the patient.  Importance of compliance with diet medication stressed and patient verbalized standing. She is doing very well with exercise and I congratulated her about this. Aortic atherosclerosis: Stable and discussed second prevention with her. Mixed dyslipidemia: Lipid-lowering medications are on board and she takes them regularly.  Lipids followed by primary care.  Goal LDL must be less than 60. Essential hypertension: Blood pressure stable and diet was emphasized.  Salt and diet issues were discussed she is very compliant with this. Patient will be seen in follow-up appointment in 6 months or earlier if the patient has any concerns.    Medication Adjustments/Labs and Tests Ordered: Current medicines are reviewed at length with the patient today.  Concerns regarding medicines are outlined above.  Orders Placed This Encounter  Procedures   EKG 12-Lead   No orders of the defined types were placed in this encounter.    Chief Complaint  Patient presents with   Follow-up     History of Present Illness:    Jaime Sutton is a 78 y.o. female.  Patient has past medical history of aortic atherosclerosis, essential hypertension, dyslipidemia.  She denies any problems at this time and takes care of activities of daily living.  No chest pain orthopnea or PND.  At the time of my evaluation, the patient is alert awake oriented and in no distress.  She exercises on a regular basis.  She has no symptoms with exercise.  [facility]  Past Medical History:  Diagnosis Date   Aortic atherosclerosis (HCC) 03/12/2020   Bilateral carotid bruits 11/10/2015   Carotid atherosclerosis 05/14/2021   Dyslipidemia 11/10/2015   Essential hypertension 05/09/2016   Thyroid disease    Traumatic avulsion of nail plate of toe 13/24/4010    Past Surgical History:  Procedure Laterality Date   ABDOMINAL HYSTERECTOMY     APPENDECTOMY     BLADDER SURGERY     BREAST SURGERY     breast lift   CORONARY ANGIOPLASTY WITH STENT PLACEMENT  10/06/2015   Left heart catheterization w/ intervention; surgeon: Merleen Milliner, MD,Location: [facility]   NASAL SEPTUM SURGERY     ROOT CANAL      Current Medications: Current Meds  Medication Sig   acyclovir (ZOVIRAX) 400 MG tablet Take 800 mg by mouth 4 (four) times daily as needed (cold sore outbreaks).   aspirin EC 81 MG tablet Take 81 mg by mouth every other day.   ferrous sulfate 325 (65 FE) MG EC tablet Take 325 mg by mouth daily with breakfast.   gabapentin (NEURONTIN) 300 MG capsule Take 300 mg by mouth 3 (three) times daily as needed (pain).   levothyroxine (SYNTHROID) 100 MCG tablet Take 100 mcg by mouth daily before breakfast.   losartan (COZAAR) 100 MG tablet Take 1 tablet (100 mg total) by mouth daily.   MAGNESIUM CITRATE PO Take 1 tablet by mouth daily.   Multiple Vitamins-Minerals (PRESERVISION AREDS PO) Take 1 tablet  by mouth daily.   rosuvastatin (CRESTOR) 10 MG tablet Take 10 mg by mouth daily.   [DISCONTINUED] calcium-vitamin D (OSCAL WITH D) 500-200 MG-UNIT TABS tablet Take 1 tablet by mouth daily.   [DISCONTINUED] gabapentin (NEURONTIN) 100 MG capsule Take 100 mg by mouth as needed for pain (pain).   [DISCONTINUED] meloxicam (MOBIC) 15 MG tablet Take 15 mg by mouth daily.   [DISCONTINUED] Omega-3 1000 MG CAPS Take 1,000 mg by mouth daily.   [DISCONTINUED] simvastatin (ZOCOR) 40 MG tablet TAKE 1 TABLET(40 MG) BY MOUTH DAILY AT 6 PM (Patient taking differently:  Take 40 mg by mouth daily at 6 PM.)     Allergies:   Codeine   Social History   Socioeconomic History   Marital status: Unknown    Spouse name: Not on file   Number of children: Not on file   Years of education: Not on file   Highest education level: Not on file  Occupational History   Not on file  Tobacco Use   Smoking status: Never   Smokeless tobacco: Never  Substance and Sexual Activity   Alcohol use: Yes    Alcohol/week: 0.0 standard drinks of alcohol   Drug use: No   Sexual activity: Not on file  Other Topics Concern   Not on file  Social History Narrative   Not on file   Social Drivers of Health   Financial Resource Strain: Not on file  Food Insecurity: Not on file  Transportation Needs: Not on file  Physical Activity: Not on file  Stress: Not on file  Social Connections: Not on file     Family History: The patient's family history includes Aneurysm in her sister; Cancer in her sister; Heart attack in her sister; Heart disease in her father; Heart failure in her mother and sister.  ROS:   Please see the history of present illness.    All other systems reviewed and are negative.  EKGs/Labs/Other Studies Reviewed:    The following studies were reviewed today: .Marland KitchenEKG Interpretation Date/Time:  Tuesday May 16 2023 15:39:33 EDT Ventricular Rate:  82 PR Interval:  124 QRS Duration:  80 QT Interval:  366 QTC Calculation: 427 R Axis:   0  Text Interpretation: Normal sinus rhythm Low voltage QRS Borderline ECG No previous ECGs available Confirmed by Belva Crome [phone]) on 05/16/2023 3:47:45 PM     Recent Labs: No results found for requested labs within last 365 days.  Recent Lipid Panel    Component Value Date/Time   CHOL 129 06/01/2021 0819   TRIG 74 06/01/2021 0819   HDL 44 06/01/2021 0819   CHOLHDL 2.9 06/01/2021 0819   LDLCALC 70 06/01/2021 0819    Physical Exam:    VS:  BP 110/68 (BP Location: Left Arm, Patient Position: Sitting)    Pulse 82   Ht 5\' 2"  (1.575 m)   Wt 154 lb 6.4 oz (70 kg)   SpO2 95%   BMI 28.24 kg/m     Wt Readings from Last 3 Encounters:  05/16/23 154 lb 6.4 oz (70 kg)  03/02/22 167 lb 9.6 oz (76 kg)  05/14/21 143 lb 3.2 oz (65 kg)     GEN: Patient is in no acute distress HEENT: Normal NECK: No JVD; No carotid bruits LYMPHATICS: No lymphadenopathy CARDIAC: Hear sounds regular, 2/6 systolic murmur at the apex. RESPIRATORY:  Clear to auscultation without rales, wheezing or rhonchi  ABDOMEN: Soft, non-tender, non-distended MUSCULOSKELETAL:  No edema; No deformity  SKIN: Warm and  dry NEUROLOGIC:  Alert and oriented x 3 PSYCHIATRIC:  Normal affect   Signed, Garwin Brothers, MD  05/16/2023 3:49 PM     [facility]

## 2023-05-29 DIAGNOSIS — E039 Hypothyroidism, unspecified: Secondary | ICD-10-CM | POA: Diagnosis not present

## 2023-07-25 DIAGNOSIS — E039 Hypothyroidism, unspecified: Secondary | ICD-10-CM | POA: Diagnosis not present

## 2023-09-26 DIAGNOSIS — E785 Hyperlipidemia, unspecified: Secondary | ICD-10-CM | POA: Diagnosis not present

## 2023-09-26 DIAGNOSIS — Z79899 Other long term (current) drug therapy: Secondary | ICD-10-CM | POA: Diagnosis not present

## 2023-09-26 DIAGNOSIS — E039 Hypothyroidism, unspecified: Secondary | ICD-10-CM | POA: Diagnosis not present

## 2023-09-26 DIAGNOSIS — I10 Essential (primary) hypertension: Secondary | ICD-10-CM | POA: Diagnosis not present

## 2023-09-26 DIAGNOSIS — Z6828 Body mass index (BMI) 28.0-28.9, adult: Secondary | ICD-10-CM | POA: Diagnosis not present

## 2023-09-26 DIAGNOSIS — R0982 Postnasal drip: Secondary | ICD-10-CM | POA: Diagnosis not present

## 2023-09-26 DIAGNOSIS — M47812 Spondylosis without myelopathy or radiculopathy, cervical region: Secondary | ICD-10-CM | POA: Diagnosis not present

## 2023-11-08 DIAGNOSIS — Z961 Presence of intraocular lens: Secondary | ICD-10-CM | POA: Diagnosis not present

## 2023-11-08 DIAGNOSIS — H353132 Nonexudative age-related macular degeneration, bilateral, intermediate dry stage: Secondary | ICD-10-CM | POA: Diagnosis not present
